# Patient Record
Sex: Male | Born: 2000 | Race: Black or African American | Hispanic: No | Marital: Single | Smoking: Never smoker
Health system: Southern US, Community
[De-identification: ages and names within clinical notes are randomized; demographics above are authoritative.]

---
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
## 2019-03-02 ENCOUNTER — Ambulatory Visit (HOSPITAL_COMMUNITY)
Admission: EM | Admit: 2019-03-02 | Discharge: 2019-03-02 | Disposition: A | Discharge status: Home / Self Care | Payer: Medicaid Other | Attending: Family Medicine | Admitting: Family Medicine

## 2019-03-02 ENCOUNTER — Other Ambulatory Visit: Payer: Self-pay

## 2019-03-02 ENCOUNTER — Encounter (HOSPITAL_COMMUNITY): Payer: Self-pay

## 2019-03-02 DIAGNOSIS — Z113 Encounter for screening for infections with a predominantly sexual mode of transmission: Secondary | ICD-10-CM | POA: Diagnosis present

## 2019-03-02 LAB — HIV ANTIBODY (ROUTINE TESTING W REFLEX): HIV Screen 4th Generation wRfx: NONREACTIVE

## 2019-03-02 NOTE — Discharge Instructions (Addendum)
We have sent testing for sexually transmitted infections. We will notify you of any positive results once they are received. If required, we will prescribe any medications you might need.  Please refrain from all sexual activity for at least the next seven days.  

## 2019-03-02 NOTE — ED Triage Notes (Signed)
Pt c/o "bumps" to penis, requests STI testing. Denies dysuria sx, abd pain, fever, chills.

## 2019-03-02 NOTE — ED Provider Notes (Signed)
[place]   [id number] 03/02/19 Arrival Time: 0626  ASSESSMENT & PLAN:  1. Screen for STD (sexually transmitted disease)       Discharge Instructions     We have sent testing for sexually transmitted infections. We will notify you of any positive results once they are received. If required, we will prescribe any medications you might need.  Please refrain from all sexual activity for at least the next seven days.     Pending: Labs Reviewed  HIV ANTIBODY (ROUTINE TESTING W REFLEX)  RPR  CYTOLOGY, (ORAL, ANAL, URETHRAL) ANCILLARY ONLY   Reassured that the few bumps on his penis do not look like HSV at this time. To watch through this week.  Will notify of any positive results. Instructed to refrain from sexual activity for at least seven days.  Reviewed expectations re: course of current medical issues. Questions answered. Outlined signs and symptoms indicating need for more acute intervention. Patient verbalized understanding. After Visit Summary given.   SUBJECTIVE:  Christian Gardner is a 19 y.o. male who requests STD testing. New male sexual partner. No penile discharge. Denies: urinary frequency, dysuria and gross hematuria. Afebrile. No abdominal or pelvic pain. No n/v. No rashes or lesions. Noticed "a few bumps on my penis this morning"; not painful; no drainage or bleeding. No h/o similar. OTC treatment: none. History of STI: none reported.   OBJECTIVE:  Vitals:   03/02/19 0928  BP: 121/78  Pulse: 75  Resp: 16  Temp: 99.3 F (37.4 C)  TempSrc: Oral  SpO2: 100%     General appearance: alert, cooperative, appears stated age and no distress Throat: lips, mucosa, and tongue normal; teeth and gums normal CV: RRR Lungs: CTAB Back: no CVA tenderness; FROM at waist Abdomen: soft, non-tender GU: normal appearing genitalia; 2-3 solitary 1-53mm skin-colored bumps on shaft of penis near hair follicles; no ulcerations or vesicles; non-tender to  palpation Skin: warm and dry Psychological: alert and cooperative; normal mood and affect.    Labs Reviewed  HIV ANTIBODY (ROUTINE TESTING W REFLEX)  RPR  CYTOLOGY, (ORAL, ANAL, URETHRAL) ANCILLARY ONLY    No Known Allergies  History reviewed. No pertinent past medical history. Family History  Family history unknown: Yes   Social History   Socioeconomic History  . Marital status: Single    Spouse name: Not on file  . Number of children: Not on file  . Years of education: Not on file  . Highest education level: Not on file  Occupational History  . Not on file  Tobacco Use  . Smoking status: Current Some Day Smoker  . Smokeless tobacco: Never Used  Substance and Sexual Activity  . Alcohol use: Yes  . Drug use: Yes    Types: Marijuana  . Sexual activity: Yes    Birth control/protection: None  Other Topics Concern  . Not on file  Social History Narrative  . Not on file   Social Determinants of Health   Financial Resource Strain:   . Difficulty of Paying Living Expenses: Not on file  Food Insecurity:   . Worried About Charity fundraiser in the Last Year: Not on file  . Ran Out of Food in the Last Year: Not on file  Transportation Needs:   . Lack of Transportation (Medical): Not on file  . Lack of Transportation (Non-Medical): Not on file  Physical Activity:   . Days of Exercise per Week: Not on file  . Minutes of Exercise per Session: Not  on file  Stress:   . Feeling of Stress : Not on file  Social Connections:   . Frequency of Communication with Friends and Family: Not on file  . Frequency of Social Gatherings with Friends and Family: Not on file  . Attends Religious Services: Not on file  . Active Member of Clubs or Organizations: Not on file  . Attends Banker Meetings: Not on file  . Marital Status: Not on file  Intimate Partner Violence:   . Fear of Current or Ex-Partner: Not on file  . Emotionally Abused: Not on file  . Physically  Abused: Not on file  . Sexually Abused: Not on file          Mardella Layman, MD 03/02/19 1014

## 2019-03-03 LAB — RPR: RPR Ser Ql: NONREACTIVE

## 2019-03-03 LAB — CYTOLOGY, (ORAL, ANAL, URETHRAL) ANCILLARY ONLY
Chlamydia: NEGATIVE
Neisseria Gonorrhea: NEGATIVE
Trichomonas: NEGATIVE

## 2019-05-29 ENCOUNTER — Encounter (HOSPITAL_COMMUNITY): Payer: Self-pay

## 2019-05-29 ENCOUNTER — Other Ambulatory Visit: Payer: Self-pay

## 2019-05-29 ENCOUNTER — Ambulatory Visit (HOSPITAL_COMMUNITY)
Admission: EM | Admit: 2019-05-29 | Discharge: 2019-05-29 | Disposition: A | Discharge status: Home / Self Care | Payer: Medicaid Other | Attending: Family Medicine | Admitting: Family Medicine

## 2019-05-29 DIAGNOSIS — Z20822 Contact with and (suspected) exposure to covid-19: Secondary | ICD-10-CM | POA: Insufficient documentation

## 2019-05-29 DIAGNOSIS — Z7689 Persons encountering health services in other specified circumstances: Secondary | ICD-10-CM | POA: Insufficient documentation

## 2019-05-29 LAB — HIV ANTIBODY (ROUTINE TESTING W REFLEX): HIV Screen 4th Generation wRfx: NONREACTIVE

## 2019-05-29 NOTE — Discharge Instructions (Addendum)
Safe sex recommended Check for your results on MyChart You will be called if any of your test results are positive

## 2019-05-29 NOTE — ED Triage Notes (Signed)
Pt presents to UC for COVID testing and STD's testing. Pt denies any signs and symptoms.

## 2019-05-29 NOTE — ED Provider Notes (Signed)
[facility]    CSN: [id number] Arrival date & time: 05/29/19  1119      History   Chief Complaint Chief Complaint  Patient presents with  . STD testing  . COVID testing    HPI Christian Gardner is a 19 y.o. male.   HPI  Patient desires STD testing.  He does have sexual relations with no protection.  He does not know of any specific exposure.  He states he wants STD testing "just to be safe". Patient desires Covid testing.  No symptoms.  No specific exposure.  History reviewed. No pertinent past medical history.  There are no problems to display for this patient.   History reviewed. No pertinent surgical history.     Home Medications    Prior to Admission medications   Not on File    Family History Family History  Family history unknown: Yes    Social History Social History   Tobacco Use  . Smoking status: Current Some Day Smoker  . Smokeless tobacco: Never Used  Substance Use Topics  . Alcohol use: Yes  . Drug use: Yes    Types: Marijuana     Allergies   Patient has no known allergies.   Review of Systems Review of Systems  Constitutional:       Patient denies any symptoms     Physical Exam Triage Vital Signs ED Triage Vitals  Enc Vitals Group     BP 05/29/19 1152 126/76     Pulse Rate 05/29/19 1152 (!) 55     Resp 05/29/19 1152 15     Temp 05/29/19 1152 98.7 F (37.1 C)     Temp Source 05/29/19 1152 Oral     SpO2 05/29/19 1152 99 %     Weight --      Height --      Head Circumference --      Peak Flow --      Pain Score 05/29/19 1151 0     Pain Loc --      Pain Edu? --      Excl. in GC? --    No data found.  Updated Vital Signs BP 126/76 (BP Location: Left Arm)   Pulse (!) 55   Temp 98.7 F (37.1 C) (Oral)   Resp 15   SpO2 99%     Physical Exam Constitutional:      General: He is not in acute distress.    Appearance: He is well-developed.  HENT:     Head: Normocephalic and atraumatic.     Mouth/Throat:       Comments: Mask is in place Eyes:     Conjunctiva/sclera: Conjunctivae normal.     Pupils: Pupils are equal, round, and reactive to light.  Neck:     Comments: Exam by observation Cardiovascular:     Rate and Rhythm: Normal rate.  Pulmonary:     Effort: Pulmonary effort is normal. No respiratory distress.  Musculoskeletal:        General: Normal range of motion.     Cervical back: Normal range of motion.  Skin:    General: Skin is warm and dry.  Neurological:     Mental Status: He is alert.  Psychiatric:        Behavior: Behavior normal.      UC Treatments / Results  Labs (all labs ordered are listed, but only abnormal results are displayed) Labs Reviewed  SARS CORONAVIRUS 2 (TAT 6-24 HRS)  HIV ANTIBODY (  ROUTINE TESTING W REFLEX)  RPR  CYTOLOGY, (ORAL, ANAL, URETHRAL) ANCILLARY ONLY    EKG   Radiology No results found.  Procedures Procedures (including critical care time)  Medications Ordered in UC Medications - No data to display  Initial Impression / Assessment and Plan / UC Course  I have reviewed the triage vital signs and the nursing notes.  Pertinent labs & imaging results that were available during my care of the patient were reviewed by me and considered in my medical decision making (see chart for details).      Final Clinical Impressions(s) / UC Diagnoses   Final diagnoses:  Encounter for screening laboratory testing for COVID-19 virus  Encounter for assessment of STD exposure     Discharge Instructions     Safe sex recommended Check for your results on MyChart You will be called if any of your test results are positive    ED Prescriptions    None     PDMP not reviewed this encounter.   Raylene Everts, MD 05/29/19 [phone]

## 2019-05-30 LAB — RPR: RPR Ser Ql: NONREACTIVE

## 2019-05-30 LAB — SARS CORONAVIRUS 2 (TAT 6-24 HRS): SARS Coronavirus 2: NEGATIVE

## 2019-06-02 LAB — CYTOLOGY, (ORAL, ANAL, URETHRAL) ANCILLARY ONLY
Chlamydia: NEGATIVE
Comment: NEGATIVE
Comment: NEGATIVE
Comment: NORMAL
Neisseria Gonorrhea: NEGATIVE
Trichomonas: NEGATIVE

## 2019-07-30 DIAGNOSIS — Z419 Encounter for procedure for purposes other than remedying health state, unspecified: Secondary | ICD-10-CM | POA: Diagnosis not present

## 2019-08-04 ENCOUNTER — Encounter (HOSPITAL_COMMUNITY): Payer: Self-pay

## 2019-08-04 ENCOUNTER — Other Ambulatory Visit: Payer: Self-pay

## 2019-08-04 ENCOUNTER — Ambulatory Visit (HOSPITAL_COMMUNITY)
Admission: EM | Admit: 2019-08-04 | Discharge: 2019-08-04 | Disposition: A | Discharge status: Home / Self Care | Payer: Medicaid Other | Attending: Family Medicine | Admitting: Family Medicine

## 2019-08-04 DIAGNOSIS — Z20822 Contact with and (suspected) exposure to covid-19: Secondary | ICD-10-CM

## 2019-08-04 DIAGNOSIS — Z7689 Persons encountering health services in other specified circumstances: Secondary | ICD-10-CM

## 2019-08-04 LAB — SARS CORONAVIRUS 2 (TAT 6-24 HRS): SARS Coronavirus 2: NEGATIVE

## 2019-08-04 NOTE — ED Triage Notes (Signed)
Pt wants COVID test just to be checked, pt denies symptoms. PT wants STI testing, denies symptoms.

## 2019-08-04 NOTE — Discharge Instructions (Addendum)
Check for your results on My Chart.

## 2019-08-05 ENCOUNTER — Telehealth (HOSPITAL_COMMUNITY): Payer: Self-pay | Admitting: Emergency Medicine

## 2019-08-05 LAB — CYTOLOGY, (ORAL, ANAL, URETHRAL) ANCILLARY ONLY
Chlamydia: POSITIVE — AB
Comment: NEGATIVE
Comment: NEGATIVE
Comment: NORMAL
Neisseria Gonorrhea: NEGATIVE
Trichomonas: POSITIVE — AB

## 2019-08-05 MED ORDER — METRONIDAZOLE 500 MG PO TABS: 500.0000 mg | ORAL_TABLET | Freq: Two times a day (BID) | ORAL | 0 refills | Status: DC

## 2019-08-05 MED ORDER — AZITHROMYCIN 250 MG PO TABS: 1000.0000 mg | ORAL_TABLET | Freq: Once | ORAL | 0 refills | Status: AC

## 2019-08-05 NOTE — Telephone Encounter (Signed)
Attempted to call patient, LVM for return call.  Sent in Azithromycin 1g x 1, and Metronidazole 500mg  BID x 7 days to pharmacy on file.

## 2019-08-05 NOTE — ED Provider Notes (Signed)
I have seen patient recently for the same complaint He has no symptoms whatsoever  he has no known exposure to illness that he will admit to He wants STD testing, swab only no blood He wants Covid testing He states that he wants them "to be safe" He does admit to unprotected sexual relations  BP 132/71   Pulse 69   Temp 98.2 F (36.8 C) (Oral)   Resp 16   Ht 6\' 1"  (1.854 m)   Wt 72.6 kg   SpO2 100%   BMI 21.11 kg/m   Patient appears lean athletic.  In no distress  Test testing is performed  Patient is notified to get results on MyChart  Safe sex is recommended  Free-Covid testing is available    , MD 08/05/19 2113

## 2019-08-17 ENCOUNTER — Telehealth: Payer: Self-pay | Admitting: *Deleted

## 2019-08-17 NOTE — Telephone Encounter (Signed)
Attempted to call patient in response to red Emmi, left message for return number

## 2019-08-30 DIAGNOSIS — Z419 Encounter for procedure for purposes other than remedying health state, unspecified: Secondary | ICD-10-CM | POA: Diagnosis not present

## 2019-09-15 ENCOUNTER — Ambulatory Visit
Admission: EM | Admit: 2019-09-15 | Discharge: 2019-09-15 | Disposition: A | Discharge status: Home / Self Care | Payer: Medicaid Other | Attending: Physician Assistant | Admitting: Physician Assistant

## 2019-09-15 DIAGNOSIS — Z113 Encounter for screening for infections with a predominantly sexual mode of transmission: Secondary | ICD-10-CM | POA: Insufficient documentation

## 2019-09-15 DIAGNOSIS — Z1152 Encounter for screening for COVID-19: Secondary | ICD-10-CM | POA: Insufficient documentation

## 2019-09-15 NOTE — ED Provider Notes (Signed)
[facility]    CSN: [id number] Arrival date & time: 09/15/19  1026      History   Chief Complaint Chief Complaint  Patient presents with  . SEXUALLY TRANSMITTED DISEASE    HPI Christian Gardner is a 19 y.o. male.   19 year old male comes in for STD testing. He is asymptomatic. Was treated for chlamydia and trich 1 month ago and would like recheck.     History reviewed. No pertinent past medical history.  There are no problems to display for this patient.   History reviewed. No pertinent surgical history.     Home Medications    Prior to Admission medications   Not on File    Family History Family History  Family history unknown: Yes    Social History Social History   Tobacco Use  . Smoking status: Never Smoker  . Smokeless tobacco: Never Used  Vaping Use  . Vaping Use: Never used  Substance Use Topics  . Alcohol use: Yes    Comment: occ  . Drug use: Yes    Types: Marijuana     Allergies   Patient has no known allergies.   Review of Systems Review of Systems  Reason unable to perform ROS: See HPI as above.     Physical Exam Triage Vital Signs ED Triage Vitals  Enc Vitals Group     BP 09/15/19 1049 126/79     Pulse Rate 09/15/19 1049 73     Resp 09/15/19 1049 18     Temp 09/15/19 1049 99 F (37.2 C)     Temp Source 09/15/19 1049 Oral     SpO2 09/15/19 1049 99 %     Weight --      Height --      Head Circumference --      Peak Flow --      Pain Score 09/15/19 1050 0     Pain Loc --      Pain Edu? --      Excl. in GC? --    No data found.  Updated Vital Signs BP 126/79 (BP Location: Left Arm)   Pulse 73   Temp 99 F (37.2 C) (Oral)   Resp 18   SpO2 99%   Visual Acuity Right Eye Distance:   Left Eye Distance:   Bilateral Distance:    Right Eye Near:   Left Eye Near:    Bilateral Near:     Physical Exam Constitutional:      General: He is not in acute distress.    Appearance: Normal appearance. He is  well-developed. He is not toxic-appearing or diaphoretic.  HENT:     Head: Normocephalic and atraumatic.  Eyes:     Conjunctiva/sclera: Conjunctivae normal.     Pupils: Pupils are equal, round, and reactive to light.  Pulmonary:     Effort: Pulmonary effort is normal. No respiratory distress.  Musculoskeletal:     Cervical back: Normal range of motion and neck supple.  Skin:    General: Skin is warm and dry.  Neurological:     Mental Status: He is alert and oriented to person, place, and time.      UC Treatments / Results  Labs (all labs ordered are listed, but only abnormal results are displayed) Labs Reviewed  NOVEL CORONAVIRUS, NAA  CYTOLOGY, (ORAL, ANAL, URETHRAL) ANCILLARY ONLY    EKG   Radiology No results found.  Procedures Procedures (including critical care time)  Medications Ordered  in UC Medications - No data to display  Initial Impression / Assessment and Plan / UC Course  I have reviewed the triage vital signs and the nursing notes.  Pertinent labs & imaging results that were available during my care of the patient were reviewed by me and considered in my medical decision making (see chart for details).     Cytology sent, patient will be contacted with any positive results that require additional treatment. Return precautions given.   Final Clinical Impressions(s) / UC Diagnoses   Final diagnoses:  Encounter for screening for COVID-19  Screening for STD (sexually transmitted disease)    ED Prescriptions    None     PDMP not reviewed this encounter.   Belinda Fisher, PA-C 09/15/19 1134

## 2019-09-15 NOTE — Discharge Instructions (Signed)
Cytology sent, you will be contacted with any positive results that requires further treatment. Refrain from sexual activity for the next 7 days. If developing testicular swelling/pain, penile lesion/sore, follow up for reevaluation.   

## 2019-09-15 NOTE — ED Triage Notes (Signed)
Pt requesting STD and COVID testing. Denies any s/sx's. States he had a STD and tx'd for it a few weeks ago.

## 2019-09-16 LAB — NOVEL CORONAVIRUS, NAA: SARS-CoV-2, NAA: NOT DETECTED

## 2019-09-16 LAB — CYTOLOGY, (ORAL, ANAL, URETHRAL) ANCILLARY ONLY
Chlamydia: NEGATIVE
Comment: NEGATIVE
Comment: NEGATIVE
Comment: NORMAL
Neisseria Gonorrhea: NEGATIVE
Trichomonas: NEGATIVE

## 2019-09-16 LAB — SARS-COV-2, NAA 2 DAY TAT

## 2019-09-30 DIAGNOSIS — Z419 Encounter for procedure for purposes other than remedying health state, unspecified: Secondary | ICD-10-CM | POA: Diagnosis not present

## 2019-10-16 ENCOUNTER — Ambulatory Visit
Admission: EM | Admit: 2019-10-16 | Discharge: 2019-10-16 | Disposition: A | Discharge status: Home / Self Care | Payer: Medicaid Other | Attending: Physician Assistant | Admitting: Physician Assistant

## 2019-10-16 DIAGNOSIS — Z1152 Encounter for screening for COVID-19: Secondary | ICD-10-CM | POA: Insufficient documentation

## 2019-10-16 DIAGNOSIS — Z113 Encounter for screening for infections with a predominantly sexual mode of transmission: Secondary | ICD-10-CM | POA: Diagnosis not present

## 2019-10-16 NOTE — ED Provider Notes (Signed)
[facility]    CSN: [id number] Arrival date & time: 10/16/19  1843      History   Chief Complaint Chief Complaint  Patient presents with  . covid testing - no sx  . SEXUALLY TRANSMITTED DISEASE    HPI Nysir Fergusson is a 19 y.o. male.   19 year old male comes in for STD testing. Denies symptoms such as testicular pain, penile discharge, dysuria. Denies known exposure. He had cytology done 1 month ago, at the time, had wanted to make sure chlamydia cleared. Testing came back negative. States "just wanted another test"     History reviewed. No pertinent past medical history.  There are no problems to display for this patient.   History reviewed. No pertinent surgical history.     Home Medications    Prior to Admission medications   Not on File    Family History Family History  Family history unknown: Yes    Social History Social History   Tobacco Use  . Smoking status: Never Smoker  . Smokeless tobacco: Never Used  Vaping Use  . Vaping Use: Never used  Substance Use Topics  . Alcohol use: Yes    Comment: occ  . Drug use: Yes    Types: Marijuana     Allergies   Patient has no known allergies.   Review of Systems Review of Systems  Reason unable to perform ROS: See HPI as above.     Physical Exam Triage Vital Signs ED Triage Vitals [10/16/19 1857]  Enc Vitals Group     BP (!) 163/72     Pulse Rate (!) 59     Resp 18     Temp 98.8 F (37.1 C)     Temp Source Oral     SpO2 98 %     Weight      Height      Head Circumference      Peak Flow      Pain Score 0     Pain Loc      Pain Edu?      Excl. in GC?    No data found.  Updated Vital Signs BP (!) 163/72 (BP Location: Left Arm)   Pulse (!) 59   Temp 98.8 F (37.1 C) (Oral)   Resp 18   SpO2 98%   Physical Exam Constitutional:      General: He is not in acute distress.    Appearance: Normal appearance. He is well-developed. He is not toxic-appearing or  diaphoretic.  HENT:     Head: Normocephalic and atraumatic.  Eyes:     Conjunctiva/sclera: Conjunctivae normal.     Pupils: Pupils are equal, round, and reactive to light.  Pulmonary:     Effort: Pulmonary effort is normal. No respiratory distress.  Musculoskeletal:     Cervical back: Normal range of motion and neck supple.  Skin:    General: Skin is warm and dry.  Neurological:     Mental Status: He is alert and oriented to person, place, and time.      UC Treatments / Results  Labs (all labs ordered are listed, but only abnormal results are displayed) Labs Reviewed  NOVEL CORONAVIRUS, NAA  CYTOLOGY, (ORAL, ANAL, URETHRAL) ANCILLARY ONLY    EKG   Radiology No results found.  Procedures Procedures (including critical care time)  Medications Ordered in UC Medications - No data to display  Initial Impression / Assessment and Plan / UC Course  I  have reviewed the triage vital signs and the nursing notes.  Pertinent labs & imaging results that were available during my care of the patient were reviewed by me and considered in my medical decision making (see chart for details).    Cytology sent, patient will be contacted with any positive results that require additional treatment. Return precautions given.   Final Clinical Impressions(s) / UC Diagnoses   Final diagnoses:  Encounter for screening for COVID-19  Screening for STD (sexually transmitted disease)    ED Prescriptions    None     PDMP not reviewed this encounter.   Belinda Fisher, PA-C 10/16/19 1921

## 2019-10-16 NOTE — ED Triage Notes (Signed)
Pt requesting COVID testing and routine STD testing. Denies any sx's or exposures.

## 2019-10-16 NOTE — Discharge Instructions (Signed)
Cytology sent, you will be contacted with any positive results that requires further treatment.  If developing testicular swelling/pain, penile lesion/sore, follow up for reevaluation.

## 2019-10-19 LAB — NOVEL CORONAVIRUS, NAA: SARS-CoV-2, NAA: NOT DETECTED

## 2019-10-20 LAB — CYTOLOGY, (ORAL, ANAL, URETHRAL) ANCILLARY ONLY
Chlamydia: POSITIVE — AB
Comment: NEGATIVE
Comment: NEGATIVE
Comment: NORMAL
Neisseria Gonorrhea: NEGATIVE
Trichomonas: NEGATIVE

## 2019-10-21 ENCOUNTER — Telehealth (HOSPITAL_COMMUNITY): Payer: Self-pay | Admitting: Emergency Medicine

## 2019-10-21 MED ORDER — AZITHROMYCIN 250 MG PO TABS: 1000.0000 mg | ORAL_TABLET | Freq: Once | ORAL | 0 refills | Status: AC

## 2019-10-30 DIAGNOSIS — Z419 Encounter for procedure for purposes other than remedying health state, unspecified: Secondary | ICD-10-CM | POA: Diagnosis not present

## 2019-11-19 ENCOUNTER — Other Ambulatory Visit: Payer: Self-pay

## 2019-11-19 ENCOUNTER — Ambulatory Visit
Admission: EM | Admit: 2019-11-19 | Discharge: 2019-11-19 | Disposition: A | Discharge status: Home / Self Care | Payer: Medicaid Other | Attending: Emergency Medicine | Admitting: Emergency Medicine

## 2019-11-19 ENCOUNTER — Encounter: Payer: Self-pay | Admitting: *Deleted

## 2019-11-19 DIAGNOSIS — Z20822 Contact with and (suspected) exposure to covid-19: Secondary | ICD-10-CM | POA: Insufficient documentation

## 2019-11-19 DIAGNOSIS — Z113 Encounter for screening for infections with a predominantly sexual mode of transmission: Secondary | ICD-10-CM | POA: Diagnosis not present

## 2019-11-19 LAB — POCT URINALYSIS DIP (MANUAL ENTRY)
Bilirubin, UA: NEGATIVE
Blood, UA: NEGATIVE
Glucose, UA: NEGATIVE mg/dL
Ketones, POC UA: NEGATIVE mg/dL
Leukocytes, UA: NEGATIVE
Nitrite, UA: NEGATIVE
Protein Ur, POC: NEGATIVE mg/dL
Spec Grav, UA: >=1.03 — AB (ref 1.010–1.025)
Urobilinogen, UA: 0.2 E.U./dL
pH, UA: 6.5 (ref 5.0–8.0)

## 2019-11-19 NOTE — ED Provider Notes (Signed)
Emergency Department Provider Note  ____________________________________________  Time seen: Approximately 1:11 PM  I have reviewed the triage vital signs and the nursing notes.   HISTORY  Chief Complaint STD Check and Covid Test - no sxs   Historian Patient     HPI Christian Gardner is a 19 y.o. male presents to the urgent care requesting STD testing and COVID-19 testing.  Patient is well-known to this urgent care and is currently asymptomatic.   History reviewed. No pertinent past medical history.   Immunizations up to date:  Yes.     History reviewed. No pertinent past medical history.  There are no problems to display for this patient.   History reviewed. No pertinent surgical history.  Prior to Admission medications   Not on File    Allergies Patient has no known allergies.  Family History  Problem Relation Age of Onset  . Healthy Mother   . Healthy Father     Social History Social History   Tobacco Use  . Smoking status: Never Smoker  . Smokeless tobacco: Never Used  Vaping Use  . Vaping Use: Never used  Substance Use Topics  . Alcohol use: Yes    Comment: occasionally  . Drug use: Yes    Types: Marijuana     Review of Systems  Constitutional: No fever/chills Eyes:  No discharge ENT: No upper respiratory complaints. Respiratory: no cough. No SOB/ use of accessory muscles to breath Gastrointestinal:   No nausea, no vomiting.  No diarrhea.  No constipation. Musculoskeletal: Negative for musculoskeletal pain. Skin: Negative for rash, abrasions, lacerations, ecchymosis.    ____________________________________________   PHYSICAL EXAM:  VITAL SIGNS: ED Triage Vitals  Enc Vitals Group     BP 11/19/19 1227 117/74     Pulse Rate 11/19/19 1227 62     Resp 11/19/19 1227 20     Temp 11/19/19 1227 98.1 F (36.7 C)     Temp Source 11/19/19 1227 Oral     SpO2 11/19/19 1227 99 %     Weight --      Height --      Head Circumference --       Peak Flow --      Pain Score 11/19/19 1229 0     Pain Loc --      Pain Edu? --      Excl. in GC? --      Constitutional: Alert and oriented. Well appearing and in no acute distress. Eyes: Conjunctivae are normal. PERRL. EOMI. Head: Atraumatic. ENT:      Nose: No congestion/rhinnorhea.      Mouth/Throat: Mucous membranes are moist.  Neck: No stridor.  No cervical spine tenderness to palpation. Cardiovascular: Normal rate, regular rhythm. Normal S1 and S2.  Good peripheral circulation. Respiratory: Normal respiratory effort without tachypnea or retractions. Lungs CTAB. Good air entry to the bases with no decreased or absent breath sounds Gastrointestinal: Bowel sounds x 4 quadrants. Soft and nontender to palpation. No guarding or rigidity. No distention. Musculoskeletal: Full range of motion to all extremities. No obvious deformities noted Neurologic:  Normal for age. No gross focal neurologic deficits are appreciated.  Skin:  Skin is warm, dry and intact. No rash noted. Psychiatric: Mood and affect are normal for age. Speech and behavior are normal.   ____________________________________________   LABS (all labs ordered are listed, but only abnormal results are displayed)  Labs Reviewed  POCT URINALYSIS DIP (MANUAL ENTRY) - Abnormal; Notable for the following components:  Result Value   Spec Grav, UA >=1.030 (*)    All other components within normal limits  NOVEL CORONAVIRUS, NAA  CYTOLOGY, (ORAL, ANAL, URETHRAL) ANCILLARY ONLY   ____________________________________________  EKG   ____________________________________________  RADIOLOGY   No results found.  ____________________________________________    PROCEDURES  Procedure(s) performed:     Procedures     Medications - No data to display   ____________________________________________   INITIAL IMPRESSION / ASSESSMENT AND PLAN / ED COURSE  Pertinent labs & imaging results that were  available during my care of the patient were reviewed by me and considered in my medical decision making (see chart for details).      Assessment and Plan: Routine testing for COVID-19 and STDs. 19 year old male presents to the urgent care for routine testing for gonorrhea and chlamydia and COVID-19.  He is currently completely asymptomatic and has not had any recent exposures.  COVID-19 plan STD testing are in process at this time.  Urinalysis did not suggest UTI.  Return precautions were given to return with new or worsening symptoms.   ____________________________________________  FINAL CLINICAL IMPRESSION(S) / ED DIAGNOSES  Final diagnoses:  Encounter for laboratory testing for COVID-19 virus  Screen for STD (sexually transmitted disease)      NEW MEDICATIONS STARTED DURING THIS VISIT:  ED Discharge Orders    None          This chart was dictated using voice recognition software/Dragon. Despite best efforts to proofread, errors can occur which can change the meaning. Any change was purely unintentional.     Orvil Feil, PA-C 11/19/19 1314

## 2019-11-19 NOTE — ED Triage Notes (Signed)
Pt denies any penile discharge or any known exposures to STDs; wishes to be checked for STDs. Denies any Covid sxs or exposures, but wishes to be tested.

## 2019-11-20 LAB — NOVEL CORONAVIRUS, NAA: SARS-CoV-2, NAA: NOT DETECTED

## 2019-11-20 LAB — SARS-COV-2, NAA 2 DAY TAT

## 2019-11-24 LAB — CYTOLOGY, (ORAL, ANAL, URETHRAL) ANCILLARY ONLY
Chlamydia: NEGATIVE
Comment: NEGATIVE
Comment: NORMAL
Neisseria Gonorrhea: NEGATIVE

## 2019-11-30 DIAGNOSIS — Z419 Encounter for procedure for purposes other than remedying health state, unspecified: Secondary | ICD-10-CM | POA: Diagnosis not present

## 2019-12-04 DIAGNOSIS — Z202 Contact with and (suspected) exposure to infections with a predominantly sexual mode of transmission: Secondary | ICD-10-CM | POA: Diagnosis not present

## 2019-12-04 DIAGNOSIS — Z113 Encounter for screening for infections with a predominantly sexual mode of transmission: Secondary | ICD-10-CM | POA: Diagnosis not present

## 2019-12-30 DIAGNOSIS — Z419 Encounter for procedure for purposes other than remedying health state, unspecified: Secondary | ICD-10-CM | POA: Diagnosis not present

## 2020-01-14 ENCOUNTER — Ambulatory Visit
Admission: RE | Admit: 2020-01-14 | Discharge: 2020-01-14 | Disposition: A | Discharge status: Home / Self Care | Payer: Medicaid Other | Source: Ambulatory Visit | Attending: Emergency Medicine | Admitting: Emergency Medicine

## 2020-01-14 ENCOUNTER — Other Ambulatory Visit: Payer: Self-pay

## 2020-01-14 VITALS — BP 123/80 | HR 72 | Temp 98.3°F | Resp 17

## 2020-01-14 DIAGNOSIS — Z113 Encounter for screening for infections with a predominantly sexual mode of transmission: Secondary | ICD-10-CM | POA: Insufficient documentation

## 2020-01-14 NOTE — Discharge Instructions (Signed)
We are testing you for HIV, syphillis, Gonorrhea, Chlamydia and Trichomonas. We will call you if anything is positive and let you know if you require any further treatment. Please inform partner of any positive results.  Please return if symptoms not improving with treatment, development of fever, nausea, vomiting, abdominal pain, scrotal pain.

## 2020-01-14 NOTE — ED Provider Notes (Signed)
[facility]    CSN: [id number] Arrival date & time: 01/14/20  1247      History   Chief Complaint Chief Complaint  Patient presents with  . SEXUALLY TRANSMITTED DISEASE    Asymptomatic no exposure    HPI Christian Gardner is a 19 y.o. male presenting today for STD screening.  Denies any specific symptoms or exposures.  Has regular screenings.  Would like to include HIV and syphilis today.   HPI  History reviewed. No pertinent past medical history.  There are no problems to display for this patient.   History reviewed. No pertinent surgical history.     Home Medications    Prior to Admission medications   Not on File    Family History Family History  Problem Relation Age of Onset  . Healthy Mother   . Healthy Father     Social History Social History   Tobacco Use  . Smoking status: Never Smoker  . Smokeless tobacco: Never Used  Vaping Use  . Vaping Use: Never used  Substance Use Topics  . Alcohol use: Yes    Comment: occasionally  . Drug use: Yes    Types: Marijuana     Allergies   Patient has no known allergies.   Review of Systems Review of Systems  Constitutional: Negative for fever.  HENT: Negative for sore throat.   Respiratory: Negative for shortness of breath.   Cardiovascular: Negative for chest pain.  Gastrointestinal: Negative for abdominal pain, nausea and vomiting.  Genitourinary: Negative for difficulty urinating, dysuria, frequency, penile discharge, penile pain, penile swelling, scrotal swelling and testicular pain.  Skin: Negative for rash.  Neurological: Negative for dizziness, light-headedness and headaches.     Physical Exam Triage Vital Signs ED Triage Vitals [01/14/20 1321]  Enc Vitals Group     BP 123/80     Pulse Rate 72     Resp 17     Temp 98.3 F (36.8 C)     Temp Source Oral     SpO2 99 %     Weight      Height      Head Circumference      Peak Flow      Pain Score 0     Pain Loc       Pain Edu?      Excl. in GC?    No data found.  Updated Vital Signs BP 123/80 (BP Location: Left Arm)   Pulse 72   Temp 98.3 F (36.8 C) (Oral)   Resp 17   SpO2 99%   Visual Acuity Right Eye Distance:   Left Eye Distance:   Bilateral Distance:    Right Eye Near:   Left Eye Near:    Bilateral Near:     Physical Exam Vitals and nursing note reviewed.  Constitutional:      Appearance: He is well-developed and well-nourished.     Comments: No acute distress  HENT:     Head: Normocephalic and atraumatic.     Nose: Nose normal.  Eyes:     Conjunctiva/sclera: Conjunctivae normal.  Cardiovascular:     Rate and Rhythm: Normal rate.  Pulmonary:     Effort: Pulmonary effort is normal. No respiratory distress.  Abdominal:     General: There is no distension.  Musculoskeletal:        General: Normal range of motion.     Cervical back: Neck supple.  Skin:    General: Skin is warm and  dry.  Neurological:     Mental Status: He is alert and oriented to person, place, and time.  Psychiatric:        Mood and Affect: Mood and affect normal.      UC Treatments / Results  Labs (all labs ordered are listed, but only abnormal results are displayed) Labs Reviewed  HIV ANTIBODY (ROUTINE TESTING W REFLEX)  RPR  CYTOLOGY, (ORAL, ANAL, URETHRAL) ANCILLARY ONLY    EKG   Radiology No results found.  Procedures Procedures (including critical care time)  Medications Ordered in UC Medications - No data to display  Initial Impression / Assessment and Plan / UC Course  I have reviewed the triage vital signs and the nursing notes.  Pertinent labs & imaging results that were available during my care of the patient were reviewed by me and considered in my medical decision making (see chart for details).     STD screening with urethral swab and blood work for HIV and syphilis.  Currently asymptomatic and without exposures.  Will call with results and provide treatment as  needed.  Discussed strict return precautions. Patient verbalized understanding and is agreeable with plan.  Final Clinical Impressions(s) / UC Diagnoses   Final diagnoses:  Screen for STD (sexually transmitted disease)     Discharge Instructions     We are testing you for HIV, syphillis, Gonorrhea, Chlamydia and Trichomonas. We will call you if anything is positive and let you know if you require any further treatment. Please inform partner of any positive results.  Please return if symptoms not improving with treatment, development of fever, nausea, vomiting, abdominal pain, scrotal pain.    ED Prescriptions    None     PDMP not reviewed this encounter.   Lew Dawes, [place] 01/14/20 1419

## 2020-01-14 NOTE — ED Triage Notes (Signed)
Pt denies any sti symptoms but would like to be checked. Pt is aox4 and ambulatory.

## 2020-01-15 ENCOUNTER — Telehealth (HOSPITAL_COMMUNITY): Payer: Self-pay | Admitting: Emergency Medicine

## 2020-01-15 LAB — CYTOLOGY, (ORAL, ANAL, URETHRAL) ANCILLARY ONLY
Chlamydia: NEGATIVE
Comment: NEGATIVE
Comment: NEGATIVE
Comment: NORMAL
Neisseria Gonorrhea: NEGATIVE
Trichomonas: POSITIVE — AB

## 2020-01-15 LAB — RPR: RPR Ser Ql: NONREACTIVE

## 2020-01-15 LAB — HIV ANTIBODY (ROUTINE TESTING W REFLEX): HIV Screen 4th Generation wRfx: NONREACTIVE

## 2020-01-15 MED ORDER — METRONIDAZOLE 500 MG PO TABS: 500.0000 mg | ORAL_TABLET | Freq: Two times a day (BID) | ORAL | 0 refills | Status: DC

## 2020-01-30 DIAGNOSIS — Z419 Encounter for procedure for purposes other than remedying health state, unspecified: Secondary | ICD-10-CM | POA: Diagnosis not present

## 2020-02-08 DIAGNOSIS — U071 COVID-19: Secondary | ICD-10-CM | POA: Diagnosis not present

## 2020-02-18 DIAGNOSIS — Z113 Encounter for screening for infections with a predominantly sexual mode of transmission: Secondary | ICD-10-CM | POA: Diagnosis not present

## 2020-02-18 DIAGNOSIS — Z7189 Other specified counseling: Secondary | ICD-10-CM | POA: Diagnosis not present

## 2020-02-18 DIAGNOSIS — Z713 Dietary counseling and surveillance: Secondary | ICD-10-CM | POA: Diagnosis not present

## 2020-02-18 DIAGNOSIS — Z1152 Encounter for screening for COVID-19: Secondary | ICD-10-CM | POA: Diagnosis not present

## 2020-02-18 DIAGNOSIS — Z0000 Encounter for general adult medical examination without abnormal findings: Secondary | ICD-10-CM | POA: Diagnosis not present

## 2020-02-25 DIAGNOSIS — R748 Abnormal levels of other serum enzymes: Secondary | ICD-10-CM | POA: Diagnosis not present

## 2020-03-01 DIAGNOSIS — R519 Headache, unspecified: Secondary | ICD-10-CM | POA: Diagnosis not present

## 2020-03-01 DIAGNOSIS — Z419 Encounter for procedure for purposes other than remedying health state, unspecified: Secondary | ICD-10-CM | POA: Diagnosis not present

## 2020-03-01 DIAGNOSIS — Z20822 Contact with and (suspected) exposure to covid-19: Secondary | ICD-10-CM | POA: Diagnosis not present

## 2020-03-01 DIAGNOSIS — R5383 Other fatigue: Secondary | ICD-10-CM | POA: Diagnosis not present

## 2020-03-01 DIAGNOSIS — J029 Acute pharyngitis, unspecified: Secondary | ICD-10-CM | POA: Diagnosis not present

## 2020-03-03 DIAGNOSIS — J029 Acute pharyngitis, unspecified: Secondary | ICD-10-CM | POA: Diagnosis not present

## 2020-03-03 DIAGNOSIS — R0981 Nasal congestion: Secondary | ICD-10-CM | POA: Diagnosis not present

## 2020-03-07 ENCOUNTER — Telehealth: Payer: Medicaid Other | Admitting: Physician Assistant

## 2020-03-07 DIAGNOSIS — Z202 Contact with and (suspected) exposure to infections with a predominantly sexual mode of transmission: Secondary | ICD-10-CM | POA: Diagnosis not present

## 2020-03-07 MED ORDER — TINIDAZOLE 500 MG PO TABS: 2.0000 g | ORAL_TABLET | Freq: Once | ORAL | 0 refills | Status: AC

## 2020-03-07 NOTE — Progress Notes (Signed)
Christian Gardner, sones are scheduled for a virtual visit with your provider today.    Just as we do with appointments in the office, we must obtain your consent to participate.  Your consent will be active for this visit and any virtual visit you may have with one of our providers in the next 365 days.    If you have a MyChart account, I can also send a copy of this consent to you electronically.  All virtual visits are billed to your insurance company just like a traditional visit in the office.  As this is a virtual visit, video technology does not allow for your provider to perform a traditional examination.  This may limit your provider's ability to fully assess your condition.  If your provider identifies any concerns that need to be evaluated in person or the need to arrange testing such as labs, EKG, etc, we will make arrangements to do so.    Although advances in technology are sophisticated, we cannot ensure that it will always work on either your end or our end.  If the connection with a video visit is poor, we may have to switch to a telephone visit.  With either a video or telephone visit, we are not always able to ensure that we have a secure connection.   I need to obtain your verbal consent now.   Are you willing to proceed with your visit today?   Christian Gardner has provided verbal consent on 03/07/2020 for a virtual visit (video or telephone).   Piedad Climes, PA-C 03/07/2020  6:13 PM      Virtual Visit via Video   I connected with patient on 03/07/20 at  6:30 PM EST by a video enabled telemedicine application and verified that I am speaking with the correct person using two identifiers.  Location patient: Home Location provider: [facility] Persons participating in the virtual visit: Patient, Provider  I discussed the limitations of evaluation and management by telemedicine and the availability of in person appointments. The patient expressed understanding and  agreed to proceed.  Subjective:   HPI:   Patient presents via [facility] today to discuss potential recent exposure to trichomoniasis from sexual partner.  Patient notes he was just notified by recent partner that she tested positive for trichomoniasis.  Other testing was negative.  Patient states he has a history of trichomoniasis, treated in December of last year.  Notes he is not having any GU symptoms.  Is wanting to know if the medicine can be sent in for him.  ROS:   See pertinent positives and negatives per HPI.  There are no problems to display for this patient.   Social History   Tobacco Use  . Smoking status: Never Smoker  . Smokeless tobacco: Never Used  Substance Use Topics  . Alcohol use: Yes    Comment: occasionally    Current Outpatient Medications:  .  metroNIDAZOLE (FLAGYL) 500 MG tablet, Take 1 tablet (500 mg total) by mouth 2 (two) times daily., Disp: 14 tablet, Rfl: 0  No Known Allergies  Objective:   There were no vitals taken for this visit.  Patient is well-developed, well-nourished in no acute distress.  Resting comfortably at home.  Head is normocephalic, atraumatic.  No labored breathing.  Speech is clear and coherent with logical content.  Patient is alert and oriented at baseline.   Assessment and Plan:   1. Exposure to trichomonas Known exposure.  Asymptomatic.  Feel it  would be best to empirically treat for trichomoniasis to prevent passing this along.  Will Rx atenolol to take as directed.  Discussed with patient that even though his partner said other tests were clear, it is in his best interest to get a full STI test.  He does have a primary care provider endorses he will call them first thing in the morning to get an appointment for further testing and treatment if indicated.  Safe sex practices reviewed with patient. - tinidazole (TINDAMAX) 500 MG tablet; Take 4 tablets (2,000 mg total) by mouth once for 1 dose.  Dispense: 4 tablet;  Refill: 0    Piedad Climes, [place] 03/07/2020

## 2020-03-11 DIAGNOSIS — Z113 Encounter for screening for infections with a predominantly sexual mode of transmission: Secondary | ICD-10-CM | POA: Diagnosis not present

## 2020-03-11 DIAGNOSIS — Z7253 High risk bisexual behavior: Secondary | ICD-10-CM | POA: Diagnosis not present

## 2020-03-11 DIAGNOSIS — R748 Abnormal levels of other serum enzymes: Secondary | ICD-10-CM | POA: Diagnosis not present

## 2020-03-29 DIAGNOSIS — Z419 Encounter for procedure for purposes other than remedying health state, unspecified: Secondary | ICD-10-CM | POA: Diagnosis not present

## 2020-04-29 DIAGNOSIS — Z419 Encounter for procedure for purposes other than remedying health state, unspecified: Secondary | ICD-10-CM | POA: Diagnosis not present

## 2020-05-29 DIAGNOSIS — Z419 Encounter for procedure for purposes other than remedying health state, unspecified: Secondary | ICD-10-CM | POA: Diagnosis not present

## 2020-06-29 DIAGNOSIS — Z419 Encounter for procedure for purposes other than remedying health state, unspecified: Secondary | ICD-10-CM | POA: Diagnosis not present

## 2020-07-14 ENCOUNTER — Encounter (HOSPITAL_COMMUNITY): Payer: Self-pay | Admitting: Emergency Medicine

## 2020-07-14 ENCOUNTER — Emergency Department (HOSPITAL_COMMUNITY): Payer: Medicaid Other

## 2020-07-14 ENCOUNTER — Emergency Department (HOSPITAL_COMMUNITY)
Admission: EM | Admit: 2020-07-14 | Discharge: 2020-07-14 | Disposition: A | Discharge status: Home / Self Care | Payer: Medicaid Other | Attending: Emergency Medicine | Admitting: Emergency Medicine

## 2020-07-14 ENCOUNTER — Other Ambulatory Visit: Payer: Self-pay

## 2020-07-14 DIAGNOSIS — M25561 Pain in right knee: Secondary | ICD-10-CM | POA: Diagnosis not present

## 2020-07-14 DIAGNOSIS — Y92410 Unspecified street and highway as the place of occurrence of the external cause: Secondary | ICD-10-CM | POA: Insufficient documentation

## 2020-07-14 DIAGNOSIS — S86911A Strain of unspecified muscle(s) and tendon(s) at lower leg level, right leg, initial encounter: Secondary | ICD-10-CM | POA: Diagnosis not present

## 2020-07-14 DIAGNOSIS — T148XXA Other injury of unspecified body region, initial encounter: Secondary | ICD-10-CM

## 2020-07-14 DIAGNOSIS — M79604 Pain in right leg: Secondary | ICD-10-CM

## 2020-07-14 DIAGNOSIS — W19XXXA Unspecified fall, initial encounter: Secondary | ICD-10-CM

## 2020-07-14 DIAGNOSIS — S8991XA Unspecified injury of right lower leg, initial encounter: Secondary | ICD-10-CM | POA: Diagnosis present

## 2020-07-14 DIAGNOSIS — M79651 Pain in right thigh: Secondary | ICD-10-CM | POA: Diagnosis not present

## 2020-07-14 MED ORDER — IBUPROFEN 800 MG PO TABS
800.0000 mg | ORAL_TABLET | Freq: Once | ORAL | Status: AC
  Administered 2020-07-14: 11:00:00 800 mg via ORAL
  Filled 2020-07-14: qty 1

## 2020-07-14 NOTE — ED Notes (Signed)
Ice pack provided to pt at this time. Crutches given and demonstration provided. Return demonstration by pt on correct use of crutches. Tolerated well. Stable gait noted.

## 2020-07-14 NOTE — Discharge Instructions (Addendum)
It was a pleasure meeting you today.  I am sorry that you injured your leg.  I think this is most likely a contusion to the bone versus bruise in the muscle.  Below is some information on this.  Regarding follow-up I recommend you be evaluated by the sports medicine clinic if the symptoms do not greatly improve.  Their information is in this handout and you can call to schedule an appointment.  For pain I want you to take 6-800 mg of ibuprofen 3 times daily as needed for the pain.  You can also use topical medications like Voltaren gel or Lidoderm patch.  If you have any questions or concerns please reach out to your PCP or be reevaluated in the emergency department.  I hope you have a wonderful afternoon!
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
## 2020-07-14 NOTE — ED Notes (Signed)
Able to stand up with provider and able to move right leg with mild difficulty.
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
## 2020-07-21 ENCOUNTER — Ambulatory Visit: Payer: Medicaid Other | Admitting: Family Medicine

## 2020-07-28 ENCOUNTER — Ambulatory Visit: Payer: Medicaid Other | Admitting: Family Medicine

## 2020-07-29 DIAGNOSIS — Z419 Encounter for procedure for purposes other than remedying health state, unspecified: Secondary | ICD-10-CM | POA: Diagnosis not present

## 2020-08-08 ENCOUNTER — Ambulatory Visit: Payer: Medicaid Other | Admitting: Family Medicine

## 2020-08-08 DIAGNOSIS — Z1152 Encounter for screening for COVID-19: Secondary | ICD-10-CM | POA: Diagnosis not present

## 2020-08-11 DIAGNOSIS — Z7251 High risk heterosexual behavior: Secondary | ICD-10-CM | POA: Diagnosis not present

## 2020-08-11 DIAGNOSIS — Z113 Encounter for screening for infections with a predominantly sexual mode of transmission: Secondary | ICD-10-CM | POA: Diagnosis not present

## 2020-08-11 DIAGNOSIS — R8290 Unspecified abnormal findings in urine: Secondary | ICD-10-CM | POA: Diagnosis not present

## 2020-08-29 DIAGNOSIS — Z419 Encounter for procedure for purposes other than remedying health state, unspecified: Secondary | ICD-10-CM | POA: Diagnosis not present

## 2020-09-29 DIAGNOSIS — Z419 Encounter for procedure for purposes other than remedying health state, unspecified: Secondary | ICD-10-CM | POA: Diagnosis not present

## 2020-10-29 DIAGNOSIS — Z419 Encounter for procedure for purposes other than remedying health state, unspecified: Secondary | ICD-10-CM | POA: Diagnosis not present

## 2020-11-29 DIAGNOSIS — Z419 Encounter for procedure for purposes other than remedying health state, unspecified: Secondary | ICD-10-CM | POA: Diagnosis not present

## 2020-12-29 DIAGNOSIS — Z419 Encounter for procedure for purposes other than remedying health state, unspecified: Secondary | ICD-10-CM | POA: Diagnosis not present

## 2021-01-15 ENCOUNTER — Ambulatory Visit: Payer: Medicaid Other

## 2021-01-29 DIAGNOSIS — Z419 Encounter for procedure for purposes other than remedying health state, unspecified: Secondary | ICD-10-CM | POA: Diagnosis not present

## 2021-03-01 DIAGNOSIS — Z419 Encounter for procedure for purposes other than remedying health state, unspecified: Secondary | ICD-10-CM | POA: Diagnosis not present

## 2021-03-29 DIAGNOSIS — Z419 Encounter for procedure for purposes other than remedying health state, unspecified: Secondary | ICD-10-CM | POA: Diagnosis not present

## 2021-04-29 DIAGNOSIS — Z419 Encounter for procedure for purposes other than remedying health state, unspecified: Secondary | ICD-10-CM | POA: Diagnosis not present

## 2021-05-20 DIAGNOSIS — Z202 Contact with and (suspected) exposure to infections with a predominantly sexual mode of transmission: Secondary | ICD-10-CM | POA: Diagnosis not present

## 2021-05-29 DIAGNOSIS — Z419 Encounter for procedure for purposes other than remedying health state, unspecified: Secondary | ICD-10-CM | POA: Diagnosis not present

## 2021-06-29 DIAGNOSIS — Z419 Encounter for procedure for purposes other than remedying health state, unspecified: Secondary | ICD-10-CM | POA: Diagnosis not present

## 2021-07-29 DIAGNOSIS — Z419 Encounter for procedure for purposes other than remedying health state, unspecified: Secondary | ICD-10-CM | POA: Diagnosis not present

## 2021-08-13 ENCOUNTER — Ambulatory Visit
Admission: EM | Admit: 2021-08-13 | Discharge: 2021-08-13 | Disposition: A | Discharge status: Home / Self Care | Payer: Medicaid Other | Attending: Physician Assistant | Admitting: Physician Assistant

## 2021-08-13 DIAGNOSIS — M542 Cervicalgia: Secondary | ICD-10-CM

## 2021-08-13 DIAGNOSIS — G44209 Tension-type headache, unspecified, not intractable: Secondary | ICD-10-CM | POA: Diagnosis not present

## 2021-08-13 MED ORDER — NAPROXEN 500 MG PO TABS: 500.0000 mg | ORAL_TABLET | Freq: Two times a day (BID) | ORAL | 0 refills | Status: DC

## 2021-08-13 MED ORDER — METHOCARBAMOL 500 MG PO TABS: 500.0000 mg | ORAL_TABLET | Freq: Three times a day (TID) | ORAL | 0 refills | Status: DC | PRN
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
## 2021-08-13 NOTE — Discharge Instructions (Signed)
Start Naprosyn twice daily.  Do not take NSAIDs with this medication including aspirin, ibuprofen/Advil, naproxen/Aleve.  Take Robaxin up to 3 times a day.  This to make you sleepy so do not drive or drink alcohol with taking it.  Use heat, rest, stretch for symptom relief.  If your symptoms are not proving quickly follow-up with sports medicine; call to schedule an appointment.  If you have any worsening symptoms including severe headache including the worst headache of your life, dizziness, nausea, vomiting, weakness, numbness/tingling in your arms you need to go to the emergency room.

## 2021-08-17 ENCOUNTER — Ambulatory Visit
Admission: EM | Admit: 2021-08-17 | Discharge: 2021-08-17 | Disposition: A | Discharge status: Home / Self Care | Payer: Medicaid Other | Attending: Internal Medicine | Admitting: Internal Medicine

## 2021-08-17 ENCOUNTER — Ambulatory Visit (HOSPITAL_COMMUNITY): Admission: EM | Admit: 2021-08-17 | Discharge status: Absent without leave | Payer: Medicaid Other

## 2021-08-17 ENCOUNTER — Encounter: Payer: Self-pay | Admitting: Emergency Medicine

## 2021-08-17 DIAGNOSIS — B9689 Other specified bacterial agents as the cause of diseases classified elsewhere: Secondary | ICD-10-CM

## 2021-08-17 DIAGNOSIS — H109 Unspecified conjunctivitis: Secondary | ICD-10-CM | POA: Diagnosis not present

## 2021-08-17 MED ORDER — ERYTHROMYCIN 5 MG/GM OP OINT: TOPICAL_OINTMENT | OPHTHALMIC | 0 refills | Status: AC

## 2021-08-17 NOTE — ED Triage Notes (Signed)
Pt is present today with bilateral eye irritation. Pt sx started x4 days ago

## 2021-08-17 NOTE — ED Provider Notes (Signed)
[facility]    CSN: [id number] Arrival date & time: 08/17/21  0930      History   Chief Complaint Chief Complaint  Patient presents with   Eye Pain    HPI Christian Gardner is a 21 y.o. male.   Patient presents with bilateral eye irritation, redness, itchiness that started about 4 days ago.  Denies any foreign body or trauma to the eye.  Patient reports that his cousin has similar symptoms but states that he has not been around him.  Denies any associated upper respiratory symptoms.  Patient has had watery drainage from the eyes.  Denies pain with extraocular movements.  He has used over-the-counter eyedrops with minimal improvement.  Patient reports he has intermittent blurry vision that feels like a film over his eye.  He does not wear contacts or glasses. Denies foreign body sensation.    Eye Pain    History reviewed. No pertinent past medical history.  There are no problems to display for this patient.   History reviewed. No pertinent surgical history.     Home Medications    Prior to Admission medications   Medication Sig Start Date End Date Taking? Authorizing Provider  erythromycin ophthalmic ointment Place a 1/2 inch ribbon of ointment into the lower eyelid 4 times daily for 7 days. 08/17/21  Yes Karlynn Furrow, Acie Fredrickson, FNP  methocarbamol (ROBAXIN) 500 MG tablet Take 1 tablet (500 mg total) by mouth every 8 (eight) hours as needed for muscle spasms. 08/13/21   Raspet, Noberto Retort, PA-C  naproxen (NAPROSYN) 500 MG tablet Take 1 tablet (500 mg total) by mouth 2 (two) times daily. 08/13/21   Raspet, Noberto Retort, PA-C    Family History Family History  Problem Relation Age of Onset   Healthy Mother    Healthy Father     Social History Social History   Tobacco Use   Smoking status: Never   Smokeless tobacco: Never  Vaping Use   Vaping Use: Never used  Substance Use Topics   Alcohol use: Yes    Comment: occasionally   Drug use: Yes    Types: Marijuana      Allergies   Patient has no known allergies.   Review of Systems Review of Systems Per HPI  Physical Exam Triage Vital Signs ED Triage Vitals [08/17/21 0939]  Enc Vitals Group     BP 129/73     Pulse Rate 62     Resp 18     Temp 98 F (36.7 C)     Temp src      SpO2 97 %     Weight      Height      Head Circumference      Peak Flow      Pain Score 6     Pain Loc      Pain Edu?      Excl. in GC?    No data found.  Updated Vital Signs BP 129/73   Pulse 62   Temp 98 F (36.7 C)   Resp 18   SpO2 97%   Visual Acuity Right Eye Distance: 20/25 Left Eye Distance: 20/25 Bilateral Distance: 20/20  Right Eye Near:   Left Eye Near:    Bilateral Near:     Physical Exam Constitutional:      General: He is not in acute distress.    Appearance: Normal appearance. He is not toxic-appearing or diaphoretic.  HENT:     Head: Normocephalic and  atraumatic.  Eyes:     General: Lids are normal. Lids are everted, no foreign bodies appreciated. Vision grossly intact. Gaze aligned appropriately.     Extraocular Movements: Extraocular movements intact.     Conjunctiva/sclera:     Right eye: Right conjunctiva is injected. Chemosis present. No exudate or hemorrhage.    Left eye: Left conjunctiva is injected. Chemosis present. No exudate or hemorrhage.    Pupils: Pupils are equal, round, and reactive to light.     Comments: Scleral redness throughout bilaterally.   Pulmonary:     Effort: Pulmonary effort is normal.  Neurological:     General: No focal deficit present.     Mental Status: He is alert and oriented to person, place, and time. Mental status is at baseline.  Psychiatric:        Mood and Affect: Mood normal.        Behavior: Behavior normal.        Thought Content: Thought content normal.        Judgment: Judgment normal.      UC Treatments / Results  Labs (all labs ordered are listed, but only abnormal results are displayed) Labs Reviewed - No data to  display  EKG   Radiology No results found.  Procedures Procedures (including critical care time)  Medications Ordered in UC Medications - No data to display  Initial Impression / Assessment and Plan / UC Course  I have reviewed the triage vital signs and the nursing notes.  Pertinent labs & imaging results that were available during my care of the patient were reviewed by me and considered in my medical decision making (see chart for details).     Physical exam is consistent with bacterial conjunctivitis.  Will treat with erythromycin ointment.  Will defer fluorescein stain given no foreign body sensation and no obvious trauma to the eye.  Visual acuity appears normal.  Advised patient to follow-up with eye doctor tomorrow to schedule appointment for further evaluation and management.  Discussed return precautions.  Patient verbalized understanding and was agreeable with plan. Final Clinical Impressions(s) / UC Diagnoses   Final diagnoses:  Bacterial conjunctivitis of both eyes     Discharge Instructions      I am suspicious that you have an infection of your eye so this is being treated with antibiotic ointment.  Please follow-up with eye doctor today to schedule appointment for further evaluation and management.    ED Prescriptions     Medication Sig Dispense Auth. Provider   erythromycin ophthalmic ointment Place a 1/2 inch ribbon of ointment into the lower eyelid 4 times daily for 7 days. 3.5 g Gustavus Bryant, [place]      PDMP not reviewed this encounter.   Gustavus Bryant, [place] 08/17/21 [phone]

## 2021-08-17 NOTE — Discharge Instructions (Signed)
I am suspicious that you have an infection of your eye so this is being treated with antibiotic ointment.  Please follow-up with eye doctor today to schedule appointment for further evaluation and management.

## 2021-08-28 ENCOUNTER — Other Ambulatory Visit (HOSPITAL_COMMUNITY): Payer: Self-pay

## 2021-08-28 ENCOUNTER — Ambulatory Visit (INDEPENDENT_AMBULATORY_CARE_PROVIDER_SITE_OTHER): Payer: Medicaid Other | Admitting: Family Medicine

## 2021-08-28 VITALS — BP 122/70 | Ht 73.0 in | Wt 180.0 lb

## 2021-08-28 DIAGNOSIS — M542 Cervicalgia: Secondary | ICD-10-CM

## 2021-08-28 DIAGNOSIS — S39012A Strain of muscle, fascia and tendon of lower back, initial encounter: Secondary | ICD-10-CM | POA: Diagnosis not present

## 2021-08-28 MED ORDER — METHOCARBAMOL 500 MG PO TABS
500.0000 mg | ORAL_TABLET | Freq: Three times a day (TID) | ORAL | 1 refills | Status: AC | PRN
  Filled 2021-08-28: qty 60, 20d supply, fill #0

## 2021-08-28 MED ORDER — NAPROXEN 500 MG PO TABS
500.0000 mg | ORAL_TABLET | Freq: Two times a day (BID) | ORAL | 1 refills | Status: AC
  Filled 2021-08-28: qty 60, 30d supply, fill #0
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
## 2021-08-28 NOTE — Patient Instructions (Addendum)
You have strains of the muscles of your neck and back. Take naproxen twice a day with food for pain and inflammation - can take as needed. Robaxin (methocarbamol) as needed for muscle pain/spasms - reserve for when you're not going to be driving or working. Heat 15 minutes at a time as needed. Start physical therapy, do home exercises on days you don't go to therapy. Follow up with me in 4 weeks.

## 2021-08-29 DIAGNOSIS — Z419 Encounter for procedure for purposes other than remedying health state, unspecified: Secondary | ICD-10-CM | POA: Diagnosis not present

## 2021-09-05 ENCOUNTER — Other Ambulatory Visit: Payer: Self-pay

## 2021-09-05 ENCOUNTER — Ambulatory Visit: Payer: Medicaid Other | Attending: Family Medicine

## 2021-09-05 DIAGNOSIS — M436 Torticollis: Secondary | ICD-10-CM | POA: Insufficient documentation

## 2021-09-05 DIAGNOSIS — M542 Cervicalgia: Secondary | ICD-10-CM | POA: Insufficient documentation
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
## 2021-09-15 ENCOUNTER — Ambulatory Visit: Payer: Medicaid Other

## 2021-09-22 ENCOUNTER — Ambulatory Visit: Payer: Medicaid Other

## 2021-09-22 DIAGNOSIS — M542 Cervicalgia: Secondary | ICD-10-CM | POA: Diagnosis not present

## 2021-09-22 DIAGNOSIS — M436 Torticollis: Secondary | ICD-10-CM

## 2021-09-22 NOTE — Therapy (Signed)
OUTPATIENT PHYSICAL THERAPY TREATMENT NOTE   Patient Name: Christian Gardner MRN: [id number] DOB:2001/01/01, 21 y.o., male Today's Date: 09/22/2021  PCP: Waldon Merl, PA-C REFERRING PROVIDER: Lenda Kelp, MD  END OF SESSION:   PT End of Session - 09/22/21 1410     Visit Number 2    Number of Visits 7    Date for PT Re-Evaluation 10/27/21    Authorization Type [place] MEDICAID [facility]    Authorization Time Period Approved 10 visits 09/06/21-11/05/21    Authorization - Visit Number 1    Authorization - Number of Visits 10    PT Start Time 1230    PT Stop Time 1314    PT Time Calculation (min) 44 min    Activity Tolerance Patient tolerated treatment well    Behavior During Therapy [facility] for tasks assessed/performed             History reviewed. No pertinent past medical history. History reviewed. No pertinent surgical history. There are no problems to display for this patient.   REFERRING DIAG: Neck pain  THERAPY DIAG:  Cervicalgia  Neck stiffness  Rationale for Evaluation and Treatment Rehabilitation  SUBJECTIVE:                                                                                                                                                                                                          SUBJECTIVE STATEMENT: Pt reports the exs have been helpful and he is able to look up and tilt his neck back further.   PAIN:  Are you having pain? Yes: NPRS scale: 3/10 Pain location: Base of neck Pain description: sharp, intermittent Aggravating factors: Looking up, sleeping on back Relieving factors: Medications, rest    PERTINENT HISTORY:  Not relevant   PRECAUTIONS: None   WEIGHT BEARING RESTRICTIONS No   FALLS:  Has patient fallen in last 6 months? No   LIVING ENVIRONMENT: Lives with: lives with their family Lives in: House/apartment No issues with accessing home or mobility within   OCCUPATION: Student   PLOF: Independent    PATIENT GOALS Relieve the pain   OBJECTIVE: (objective measures completed at initial evaluation unless otherwise dated)   DIAGNOSTIC FINDINGS:  No diagnostics   PATIENT SURVEYS:  NDI 8; 16%- Minimal limitations     COGNITION: Overall cognitive status: Within functional limits for tasks assessed     SENSATION: WFL   POSTURE: decreased thoracic kyphosis   PALPATION: TTP midline to the C7-T1 junction   CERVICAL ROM:    Active ROM A/PROM (deg) eval  Flexion 50  Extension 55 pain CT juntion  Right lateral flexion 40 tightness L  Left lateral flexion 40 tightness R  Right rotation 50  Left rotation 60   (Blank rows = not tested)   UPPER EXTREMITY ROM:            UE AROMs are WNLs     UPPER EXTREMITY MMT: UE MMTs are 5/5 and equal bil     CERVICAL SPECIAL TESTS:  Spurling's test: Negative and Distraction test: Positive     FUNCTIONAL TESTS:  NT   TODAY'S TREATMENT:  [facility] Adult PT Treatment:                                                DATE: 09/22/21 Therapeutic Exercise: Supine cervical retraction x10 3" Supine DNF x10 5" Seated Cervical retraction x10 3" Cervical rotation x3 20' Upper trap stretch x3 20" Standing open book x5 5" Shoulder abd star pattern x15 GTB Manual Therapy: STM to the upper trap, cervical paraspinals, and suboccipitals f/b MTPR to the upper traps  [facility] Adult PT Treatment:                                                DATE: 09/05/21 Therapeutic Exercise: Cervical retraction x10 3" Cervical rotation x5 20' Self Care: Sleeping positions for comfort   PATIENT EDUCATION:  Education details: Eval findings, POC, HEP, sleeping positions, hot shower or heating pad for 15 mins Person educated: Patient Education method: Explanation, Demonstration, Tactile cues, Verbal cues, and Handouts Education comprehension: verbalized understanding, returned demonstration, verbal cues required, and tactile cues required     HOME EXERCISE  PROGRAM: Access Code: [email or web address] URL: [email or web address] Date: 09/22/2021 Prepared by: Joellyn Rued  Exercises - Seated Passive Cervical Retraction  - 4-6 x daily - 7 x weekly - 1 sets - 3-5 reps - 3 hold - Standing Cervical Rotation AROM with Overpressure  - 4-6 x daily - 7 x weekly - 1 sets - 3-5 reps - 10 hold - Supine DNF Liftoffs  - 1-2 x daily - 7 x weekly - 1 sets - 10 reps - 5 hold - Seated Upper Trapezius Stretch  - 2 x daily - 7 x weekly - 1 sets - 3 reps - 10 hold - Standing Thoracic Open Book at Wall  - 1-2 x daily - 7 x weekly - 3 sets - 10 reps - 5 hold - Standing Shoulder Horizontal Abduction with Resistance  - 1-2 x daily - 7 x weekly - 2 sets - 10 reps - 2 hold   ASSESSMENT:   CLINICAL IMPRESSION: Pt returns to PT for the 1st time since the eval. He reports consistent completion of his HEP and improvement of the neck pain. Manual therapy c STM was provided and well as instruction and completion of therex for DNF and posterior chain strengthening, and cervical/upper body mobility. Pt is responding well to PT and will continue to benefit from skilled PT to address impairments for improved function with less pain.   OBJECTIVE IMPAIRMENTS decreased ROM, decreased strength, and pain.    ACTIVITY LIMITATIONS sleeping and reach over head   PARTICIPATION LIMITATIONS:  Looking up   PERSONAL FACTORS Time since onset of injury/illness/exacerbation  are also affecting patient's functional outcome.     GOALS:   SHORT TERM GOALS: Target date: 09/27/2021    Pt will be Ind in an initial HEP Baseline: started on eval Goal status: INITIAL   2.  Pt will voice understanding of measures to assist in the reduction of pain. Baseline: use of heat Goal status: INITIAL   LONG TERM GOALS: Target date: 10/27/21   Cervical ROMs will increase by 5d or more for improved neck mobility and function Baseline: see flow sheets Goal status: INITIAL   2.  Pt will report a decrease  in neck pain with looking up/extension to 2/10 or less for improved neck function and QOL Baseline: 7/10 Goal status: INITIAL   3.  Pt's NDI score will improve to 8% or less as indication of improved neck and and pain16% Baseline: 16% Goal status: INITIAL   4.  Pt willl be Ind in a final HEP to maintain achieved LOF Baseline: started on eval Goal status: INITIAL     PLAN: PT FREQUENCY: 1x/week   PT DURATION: 6 weeks   PLANNED INTERVENTIONS: Therapeutic exercises, Therapeutic activity, Patient/Family education, Self Care, Dry Needling, Electrical stimulation, Spinal manipulation, Spinal mobilization, Cryotherapy, Moist heat, Taping, Traction, Ionotophoresis 4mg /ml Dexamethasone, Manual therapy, and Re-evaluation   PLAN FOR NEXT SESSION: Assess response to HEP; progress there ex as indicated; use of modalities, manual therapy and TPDN as indicated.    , PT 09/22/2021, 2:34 PM

## 2021-09-25 ENCOUNTER — Ambulatory Visit: Payer: Medicaid Other | Admitting: Family Medicine

## 2021-09-29 ENCOUNTER — Ambulatory Visit: Payer: Medicaid Other | Attending: Family Medicine

## 2021-09-29 ENCOUNTER — Telehealth: Payer: Self-pay

## 2021-09-29 DIAGNOSIS — Z419 Encounter for procedure for purposes other than remedying health state, unspecified: Secondary | ICD-10-CM | POA: Diagnosis not present

## 2021-09-29 DIAGNOSIS — M436 Torticollis: Secondary | ICD-10-CM | POA: Insufficient documentation

## 2021-09-29 DIAGNOSIS — M542 Cervicalgia: Secondary | ICD-10-CM | POA: Insufficient documentation

## 2021-09-29 NOTE — Telephone Encounter (Signed)
Spoke to pt about a no show (1st) visit for today. Pt stated he forgot about the appt. Pt was reminded of his upcoming appt next week.

## 2021-10-04 ENCOUNTER — Ambulatory Visit: Payer: Medicaid Other

## 2021-10-04 DIAGNOSIS — M436 Torticollis: Secondary | ICD-10-CM

## 2021-10-04 DIAGNOSIS — M542 Cervicalgia: Secondary | ICD-10-CM

## 2021-10-04 NOTE — Therapy (Signed)
OUTPATIENT PHYSICAL THERAPY TREATMENT NOTE   Patient Name: Christian Gardner MRN: [id number] DOB:19-Sep-2000, 21 y.o., male Today's Date: 10/04/2021  PCP: Brunetta Jeans, PA-C REFERRING PROVIDER: Dene Gentry, MD  END OF SESSION:   PT End of Session - 10/04/21 1515     Visit Number 3    Number of Visits 7    Authorization Type [place] Time Period Approved 10 visits 09/06/21-11/05/21    Authorization - Visit Number 2    Authorization - Number of Visits 10    PT Start Time 5102    PT Stop Time 1502    PT Time Calculation (min) 40 min    Activity Tolerance Patient tolerated treatment well    Behavior During Therapy [facility] for tasks assessed/performed              History reviewed. No pertinent past medical history. History reviewed. No pertinent surgical history. There are no problems to display for this patient.   REFERRING DIAG: Neck pain  THERAPY DIAG:  Cervicalgia  Neck stiffness  Rationale for Evaluation and Treatment Rehabilitation  SUBJECTIVE:                                                                                                                                                                                                          SUBJECTIVE STATEMENT: Pt reports since the last PT session he has not been experiencing neck/upper shoulder pain.   PAIN:  Are you having pain? Yes: NPRS scale: 0/10 Pain location: Base of neck Pain description: sharp, intermittent Aggravating factors: Looking up, sleeping on back Relieving factors: Medications, rest    PERTINENT HISTORY:  Not relevant   PRECAUTIONS: None   WEIGHT BEARING RESTRICTIONS No   FALLS:  Has patient fallen in last 6 months? No   LIVING ENVIRONMENT: Lives with: lives with their family Lives in: House/apartment No issues with accessing home or mobility within   OCCUPATION: Student   PLOF: Independent   PATIENT GOALS Relieve the pain   OBJECTIVE:  (objective measures completed at initial evaluation unless otherwise dated)   DIAGNOSTIC FINDINGS:  No diagnostics   PATIENT SURVEYS:  NDI 8; 16%- Minimal limitations. 10/04/21=0%     COGNITION: Overall cognitive status: Within functional limits for tasks assessed     SENSATION: WFL   POSTURE: decreased thoracic kyphosis   PALPATION: TTP midline to the C7-T1 junction   CERVICAL ROM:    Active ROM A/PROM (deg) eval AROM 10/04/21  Flexion 50 58  Extension 55 pain CT juntion  63 pressure at end range  Right lateral flexion 40 tightness L 47  Left lateral flexion 40 tightness R 48  Right rotation 50 64  Left rotation 60 71   (Blank rows = not tested)   UPPER EXTREMITY ROM:            UE AROMs are WNLs     UPPER EXTREMITY MMT: UE MMTs are 5/5 and equal bil     CERVICAL SPECIAL TESTS:  Spurling's test: Negative and Distraction test: Positive     FUNCTIONAL TESTS:  NT   TODAY'S TREATMENT:  [facility] Adult PT Treatment:                                                DATE: 10/04/21 Therapeutic Exercise: Supine cervical retraction x5 3" Supine DNF x10 5" Seated Cervical retraction x10 3" Cervical rotation x2 20' Upper trap stretch x2 20" Standing open book x5 5" Shoulder abd star pattern 2x5 patterns GTB Review HEP Manual Therapy: STM to the upper trap, cervical paraspinals, and suboccipitals c MTPR to the upper traps  [facility] Adult PT Treatment:                                                DATE: 09/22/21 Therapeutic Exercise: Supine cervical retraction x10 3" Supine DNF x10 5" Seated Cervical retraction x10 3" Cervical rotation x3 20' Upper trap stretch x3 20" Standing open book x5 5" Shoulder abd star pattern x15 GTB Manual Therapy: STM to the upper trap, cervical paraspinals, and suboccipitals f/b MTPR to the upper traps  [facility] Adult PT Treatment:                                                DATE: 09/05/21 Therapeutic Exercise: Cervical retraction x10  3" Cervical rotation x5 20' Self Care: Sleeping positions for comfort   PATIENT EDUCATION:  Education details: Eval findings, POC, HEP, sleeping positions, hot shower or heating pad for 15 mins Person educated: Patient Education method: Explanation, Demonstration, Tactile cues, Verbal cues, and Handouts Education comprehension: verbalized understanding, returned demonstration, verbal cues required, and tactile cues required     HOME EXERCISE PROGRAM: Access Code: [email or web address] URL: [email or web address] Date: 09/22/2021 Prepared by: Gar Ponto  Exercises - Seated Passive Cervical Retraction  - 4-6 x daily - 7 x weekly - 1 sets - 3-5 reps - 3 hold - Standing Cervical Rotation AROM with Overpressure  - 4-6 x daily - 7 x weekly - 1 sets - 3-5 reps - 10 hold - Supine DNF Liftoffs  - 1-2 x daily - 7 x weekly - 1 sets - 10 reps - 5 hold - Seated Upper Trapezius Stretch  - 2 x daily - 7 x weekly - 1 sets - 3 reps - 10 hold - Standing Thoracic Open Book at [place]  - 1-2 x daily - 7 x weekly - 3 sets - 10 reps - 5 hold - Standing Shoulder Horizontal Abduction with Resistance  - 1-2 x daily - 7 x weekly - 2 sets - 10 reps - 2 hold   ASSESSMENT:  CLINICAL IMPRESSION: Pt presents to PT reporting continued improvement in neck pain with his not experiencing pain since the last PT session. Reassessment revealed improved cervical ROM and an improved NDI score, meeting the goals for both of these areas. Pt's HEP was reviewed with pt demonstrating proper return. Pt report consistent completion of his HEP. Pt is DC from PT services with goals met.   OBJECTIVE IMPAIRMENTS decreased ROM, decreased strength, and pain.    ACTIVITY LIMITATIONS sleeping and reach over head   PARTICIPATION LIMITATIONS:  Looking up   PERSONAL FACTORS Time since onset of injury/illness/exacerbation are also affecting patient's functional outcome.     GOALS:   SHORT TERM GOALS: Target date: 09/27/2021    Pt  will be Ind in an initial HEP Baseline: started on eval Goal status: MET   2.  Pt will voice understanding of measures to assist in the reduction of pain. Baseline: use of heat Goal status: MET   LONG TERM GOALS: Target date: 10/27/21   Cervical ROMs will increase by 5d or more for improved neck mobility and function Baseline: see flow sheets Status: see flow sheet Goal status: MET   2.  Pt will report a decrease in neck pain with looking up/extension to 2/10 or less for improved neck function and QOL Baseline: 7/10 Status: 0/10 Goal status: MET   3.  Pt's NDI score will improve to 8% or less as indication of improved neck and and pain16% Baseline: 16% Status: 0% Goal status: MET   4.  Pt willl be Ind in a final HEP to maintain achieved LOF Baseline: started on eval Goal status: MET     PLAN: PT FREQUENCY: 1x/week   PT DURATION: 6 weeks   PLANNED INTERVENTIONS: Therapeutic exercises, Therapeutic activity, Patient/Family education, Self Care, Dry Needling, Electrical stimulation, Spinal manipulation, Spinal mobilization, Cryotherapy, Moist heat, Taping, Traction, Ionotophoresis 25m/ml Dexamethasone, Manual therapy, and Re-evaluation   PLAN FOR NEXT SESSION:   PHYSICAL THERAPY DISCHARGE SUMMARY  Visits from Start of Care: 3  Current functional level related to goals / functional outcomes: See clinical impression and goals   Remaining deficits: See clinical impression and goals   Education / Equipment: HEP   Patient agrees to discharge. Patient goals were met. Patient is being discharged due to being pleased with the current functional level.

## 2021-10-29 DIAGNOSIS — Z419 Encounter for procedure for purposes other than remedying health state, unspecified: Secondary | ICD-10-CM | POA: Diagnosis not present

## 2021-11-29 DIAGNOSIS — Z419 Encounter for procedure for purposes other than remedying health state, unspecified: Secondary | ICD-10-CM | POA: Diagnosis not present

## 2021-12-05 DIAGNOSIS — R300 Dysuria: Secondary | ICD-10-CM | POA: Diagnosis not present

## 2021-12-05 DIAGNOSIS — Z113 Encounter for screening for infections with a predominantly sexual mode of transmission: Secondary | ICD-10-CM | POA: Diagnosis not present

## 2021-12-14 DIAGNOSIS — Z202 Contact with and (suspected) exposure to infections with a predominantly sexual mode of transmission: Secondary | ICD-10-CM | POA: Diagnosis not present

## 2021-12-29 DIAGNOSIS — Z419 Encounter for procedure for purposes other than remedying health state, unspecified: Secondary | ICD-10-CM | POA: Diagnosis not present

## 2022-01-29 DIAGNOSIS — Z419 Encounter for procedure for purposes other than remedying health state, unspecified: Secondary | ICD-10-CM | POA: Diagnosis not present

## 2022-02-02 DIAGNOSIS — Z23 Encounter for immunization: Secondary | ICD-10-CM | POA: Diagnosis not present

## 2022-02-02 DIAGNOSIS — R030 Elevated blood-pressure reading, without diagnosis of hypertension: Secondary | ICD-10-CM | POA: Diagnosis not present

## 2022-02-02 DIAGNOSIS — Z0000 Encounter for general adult medical examination without abnormal findings: Secondary | ICD-10-CM | POA: Diagnosis not present

## 2022-02-02 DIAGNOSIS — Z131 Encounter for screening for diabetes mellitus: Secondary | ICD-10-CM | POA: Diagnosis not present

## 2022-02-02 DIAGNOSIS — Z136 Encounter for screening for cardiovascular disorders: Secondary | ICD-10-CM | POA: Diagnosis not present

## 2022-02-02 DIAGNOSIS — Z13220 Encounter for screening for lipoid disorders: Secondary | ICD-10-CM | POA: Diagnosis not present

## 2022-03-01 DIAGNOSIS — Z419 Encounter for procedure for purposes other than remedying health state, unspecified: Secondary | ICD-10-CM | POA: Diagnosis not present

## 2022-03-30 DIAGNOSIS — Z419 Encounter for procedure for purposes other than remedying health state, unspecified: Secondary | ICD-10-CM | POA: Diagnosis not present

## 2022-04-30 DIAGNOSIS — Z419 Encounter for procedure for purposes other than remedying health state, unspecified: Secondary | ICD-10-CM | POA: Diagnosis not present

## 2022-05-30 DIAGNOSIS — Z419 Encounter for procedure for purposes other than remedying health state, unspecified: Secondary | ICD-10-CM | POA: Diagnosis not present

## 2022-06-30 DIAGNOSIS — Z419 Encounter for procedure for purposes other than remedying health state, unspecified: Secondary | ICD-10-CM | POA: Diagnosis not present

## 2022-07-30 DIAGNOSIS — Z419 Encounter for procedure for purposes other than remedying health state, unspecified: Secondary | ICD-10-CM | POA: Diagnosis not present

## 2022-08-17 DIAGNOSIS — Z113 Encounter for screening for infections with a predominantly sexual mode of transmission: Secondary | ICD-10-CM | POA: Diagnosis not present

## 2022-08-17 DIAGNOSIS — Z1159 Encounter for screening for other viral diseases: Secondary | ICD-10-CM | POA: Diagnosis not present

## 2022-08-17 DIAGNOSIS — R030 Elevated blood-pressure reading, without diagnosis of hypertension: Secondary | ICD-10-CM | POA: Diagnosis not present

## 2022-08-17 DIAGNOSIS — Z202 Contact with and (suspected) exposure to infections with a predominantly sexual mode of transmission: Secondary | ICD-10-CM | POA: Diagnosis not present

## 2022-08-17 DIAGNOSIS — Z114 Encounter for screening for human immunodeficiency virus [HIV]: Secondary | ICD-10-CM | POA: Diagnosis not present

## 2022-08-30 DIAGNOSIS — Z419 Encounter for procedure for purposes other than remedying health state, unspecified: Secondary | ICD-10-CM | POA: Diagnosis not present

## 2022-09-30 DIAGNOSIS — Z419 Encounter for procedure for purposes other than remedying health state, unspecified: Secondary | ICD-10-CM | POA: Diagnosis not present

## 2022-11-30 DIAGNOSIS — Z419 Encounter for procedure for purposes other than remedying health state, unspecified: Secondary | ICD-10-CM | POA: Diagnosis not present

## 2022-12-19 DIAGNOSIS — Z113 Encounter for screening for infections with a predominantly sexual mode of transmission: Secondary | ICD-10-CM | POA: Diagnosis not present

## 2022-12-30 DIAGNOSIS — Z419 Encounter for procedure for purposes other than remedying health state, unspecified: Secondary | ICD-10-CM | POA: Diagnosis not present

## 2023-01-01 DIAGNOSIS — S61211A Laceration without foreign body of left index finger without damage to nail, initial encounter: Secondary | ICD-10-CM | POA: Diagnosis not present

## 2023-01-01 DIAGNOSIS — R051 Acute cough: Secondary | ICD-10-CM | POA: Diagnosis not present

## 2023-01-30 DIAGNOSIS — Z419 Encounter for procedure for purposes other than remedying health state, unspecified: Secondary | ICD-10-CM | POA: Diagnosis not present

## 2023-02-07 DIAGNOSIS — Z114 Encounter for screening for human immunodeficiency virus [HIV]: Secondary | ICD-10-CM | POA: Diagnosis not present

## 2023-02-07 DIAGNOSIS — Z0000 Encounter for general adult medical examination without abnormal findings: Secondary | ICD-10-CM | POA: Diagnosis not present

## 2023-02-07 DIAGNOSIS — Z1330 Encounter for screening examination for mental health and behavioral disorders, unspecified: Secondary | ICD-10-CM | POA: Diagnosis not present

## 2023-02-07 DIAGNOSIS — R7303 Prediabetes: Secondary | ICD-10-CM | POA: Diagnosis not present

## 2023-02-07 DIAGNOSIS — Z113 Encounter for screening for infections with a predominantly sexual mode of transmission: Secondary | ICD-10-CM | POA: Diagnosis not present

## 2023-02-07 DIAGNOSIS — E7800 Pure hypercholesterolemia, unspecified: Secondary | ICD-10-CM | POA: Diagnosis not present

## 2023-02-07 DIAGNOSIS — Z1159 Encounter for screening for other viral diseases: Secondary | ICD-10-CM | POA: Diagnosis not present

## 2023-02-07 DIAGNOSIS — Z832 Family history of diseases of the blood and blood-forming organs and certain disorders involving the immune mechanism: Secondary | ICD-10-CM | POA: Diagnosis not present

## 2023-02-07 DIAGNOSIS — Z23 Encounter for immunization: Secondary | ICD-10-CM | POA: Diagnosis not present

## 2023-02-07 DIAGNOSIS — Z1331 Encounter for screening for depression: Secondary | ICD-10-CM | POA: Diagnosis not present

## 2023-03-02 DIAGNOSIS — Z419 Encounter for procedure for purposes other than remedying health state, unspecified: Secondary | ICD-10-CM | POA: Diagnosis not present

## 2023-03-12 DIAGNOSIS — A493 Mycoplasma infection, unspecified site: Secondary | ICD-10-CM | POA: Diagnosis not present

## 2023-03-28 DIAGNOSIS — A6001 Herpesviral infection of penis: Secondary | ICD-10-CM | POA: Diagnosis not present

## 2023-03-28 DIAGNOSIS — R140 Abdominal distension (gaseous): Secondary | ICD-10-CM | POA: Diagnosis not present

## 2023-03-28 DIAGNOSIS — M545 Low back pain, unspecified: Secondary | ICD-10-CM | POA: Diagnosis not present

## 2023-03-28 DIAGNOSIS — R198 Other specified symptoms and signs involving the digestive system and abdomen: Secondary | ICD-10-CM | POA: Diagnosis not present

## 2023-03-30 DIAGNOSIS — Z419 Encounter for procedure for purposes other than remedying health state, unspecified: Secondary | ICD-10-CM | POA: Diagnosis not present

## 2023-04-09 DIAGNOSIS — Z113 Encounter for screening for infections with a predominantly sexual mode of transmission: Secondary | ICD-10-CM | POA: Diagnosis not present

## 2023-04-09 DIAGNOSIS — A493 Mycoplasma infection, unspecified site: Secondary | ICD-10-CM | POA: Diagnosis not present

## 2023-04-22 DIAGNOSIS — A493 Mycoplasma infection, unspecified site: Secondary | ICD-10-CM | POA: Diagnosis not present

## 2023-05-10 IMAGING — DX DG KNEE COMPLETE 4+V*R*
4 series · 4 of 4 positions shown · non-contrast
Comparison: None.

CLINICAL DATA: Pain following fall

EXAM:
RIGHT KNEE - COMPLETE 4+ VIEW

[knee ap]
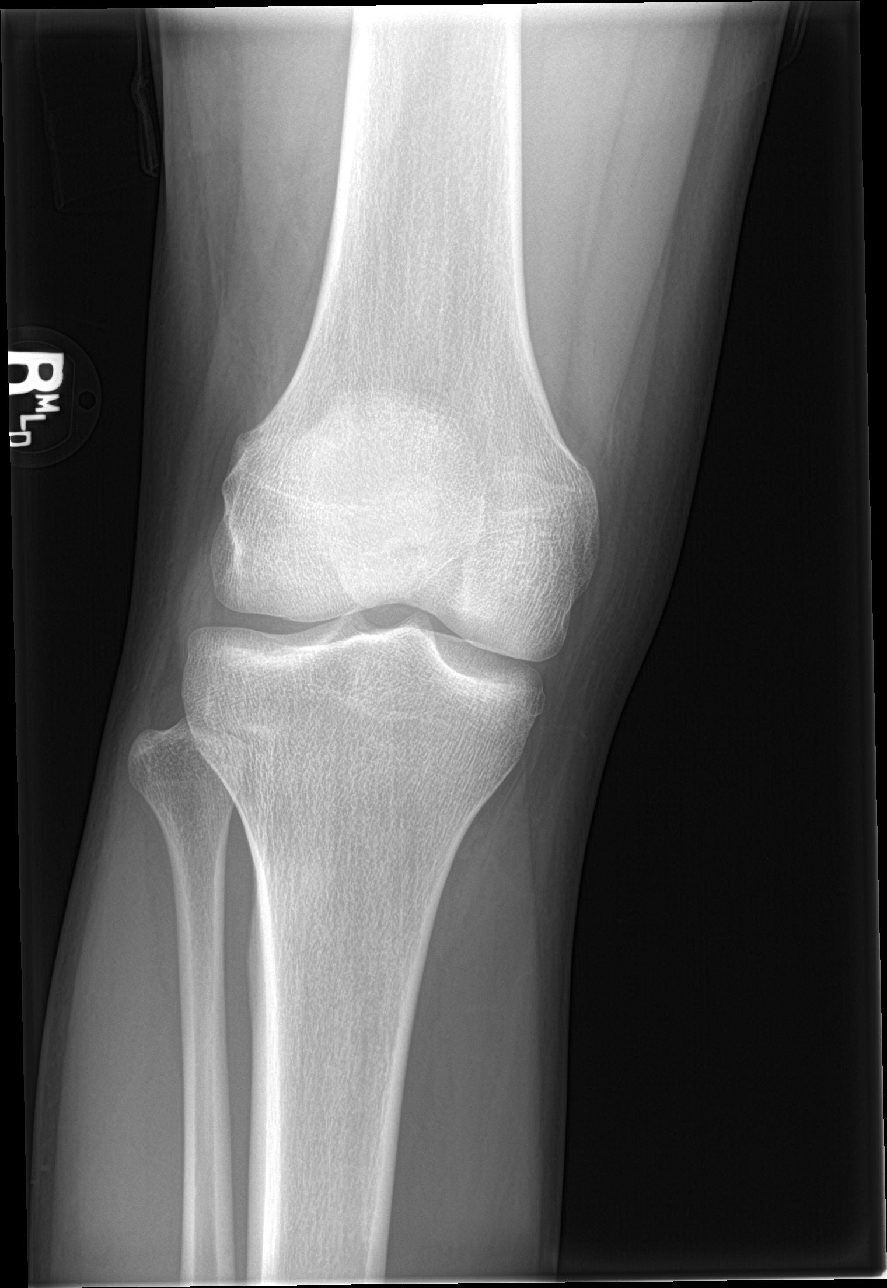

[knee lat]
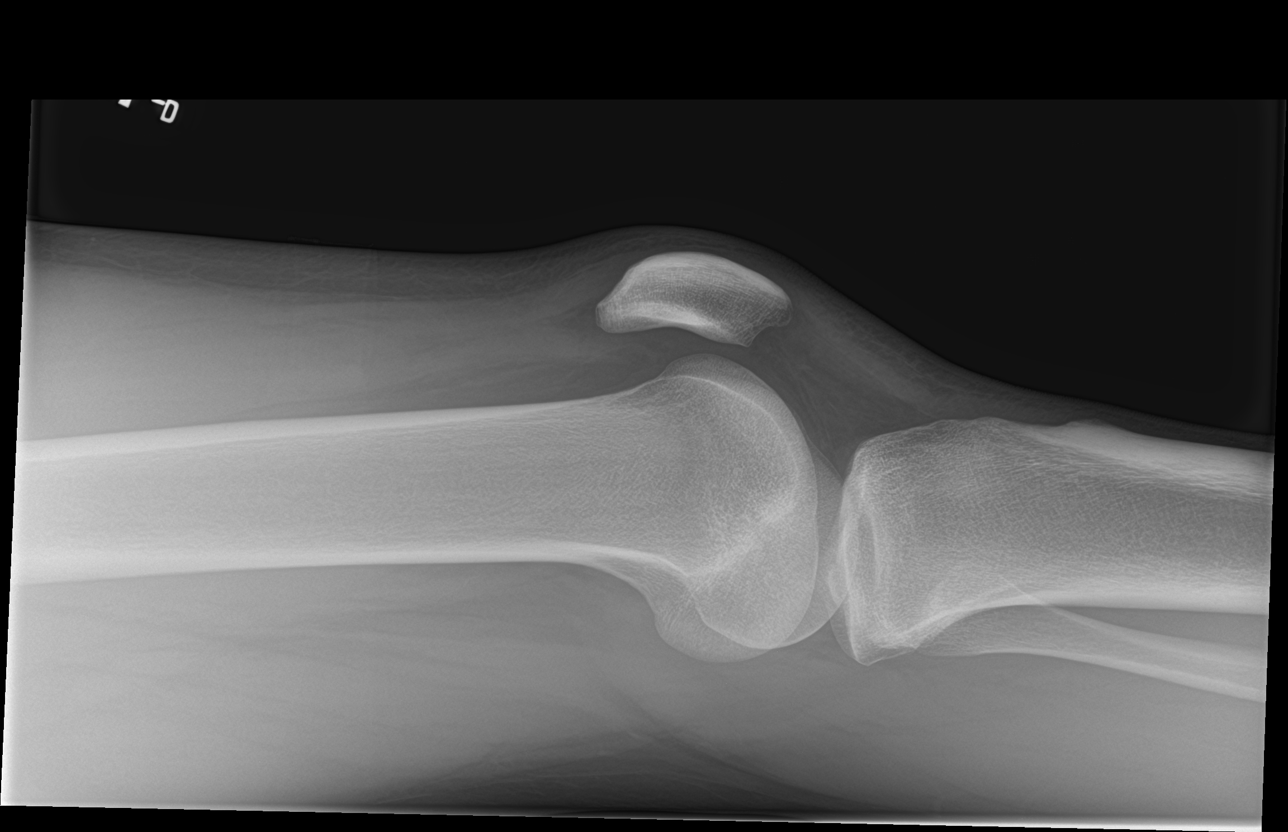

[knee obl (1 of 2)]
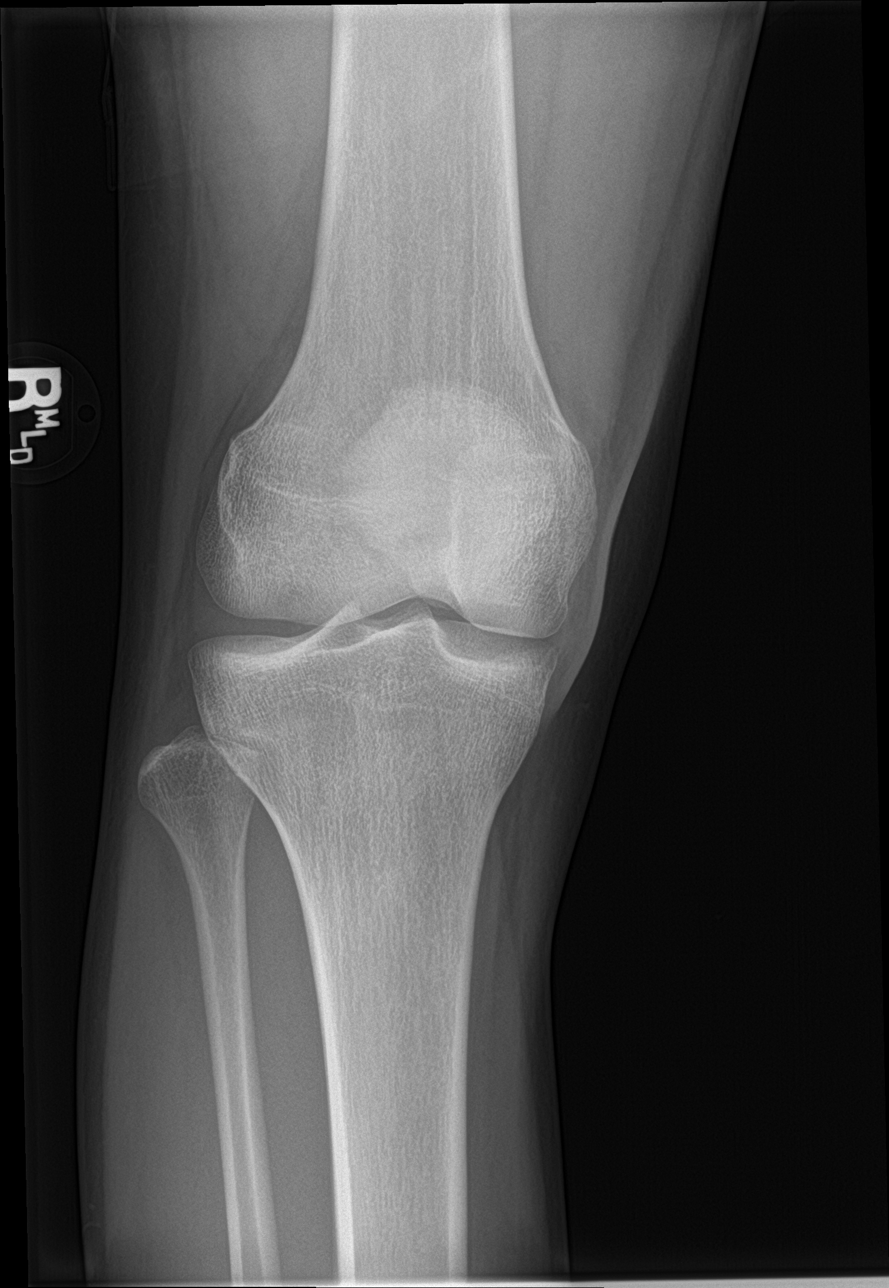

[knee obl (2 of 2)]
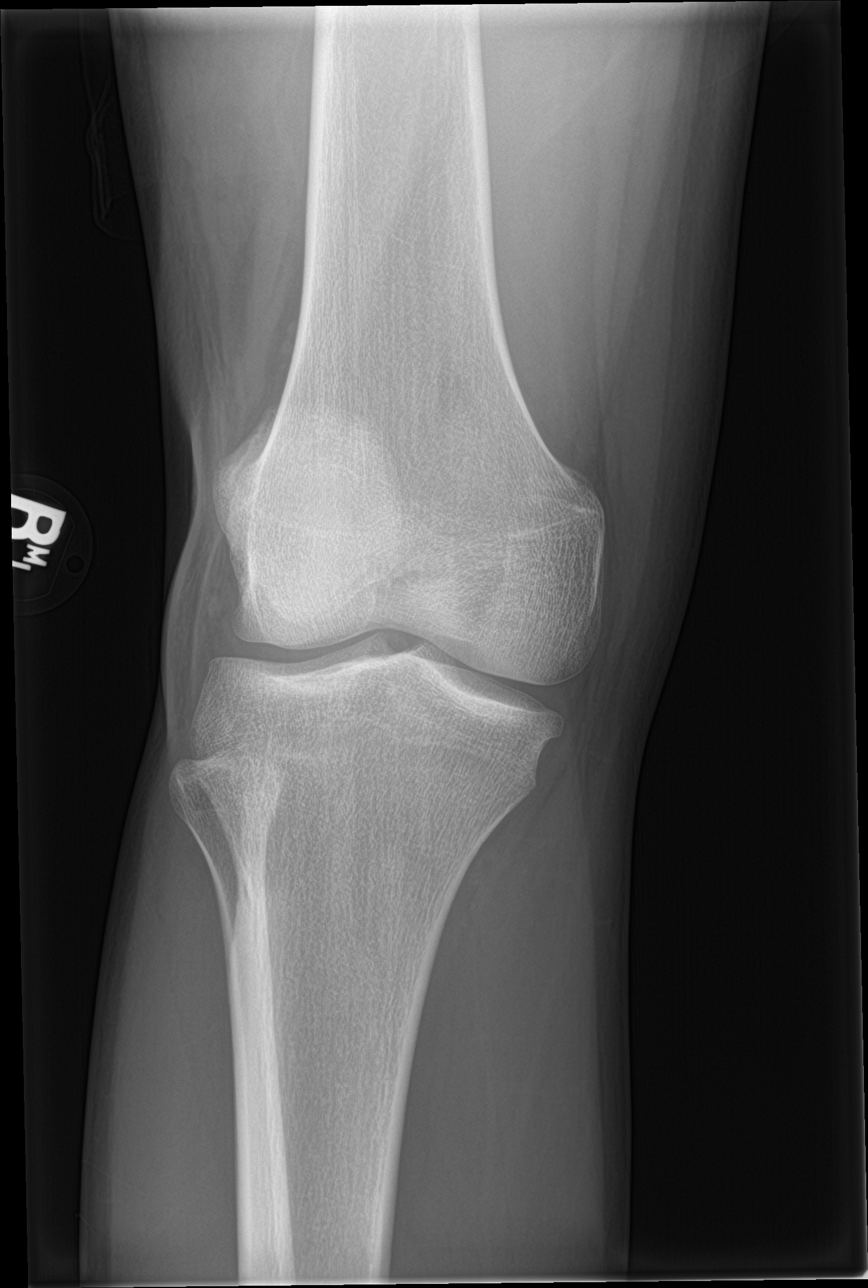

[4 of 4 positions shown; findings below may reference images not displayed]

FINDINGS: Frontal, lateral, and bilateral oblique views were obtained. No
fracture or dislocation. No appreciable joint effusion. Joint spaces
appear normal. No erosive change.
IMPRESSION: No fracture, dislocation, or joint effusion. No appreciable
arthropathic change.

## 2023-05-10 IMAGING — DX DG FEMUR 2+V*R*
4 series · 4 of 4 positions shown · non-contrast
Comparison: None.

CLINICAL DATA: Pain following fall

EXAM:
RIGHT FEMUR 2 VIEWS

[femur ap (1 of 2)]
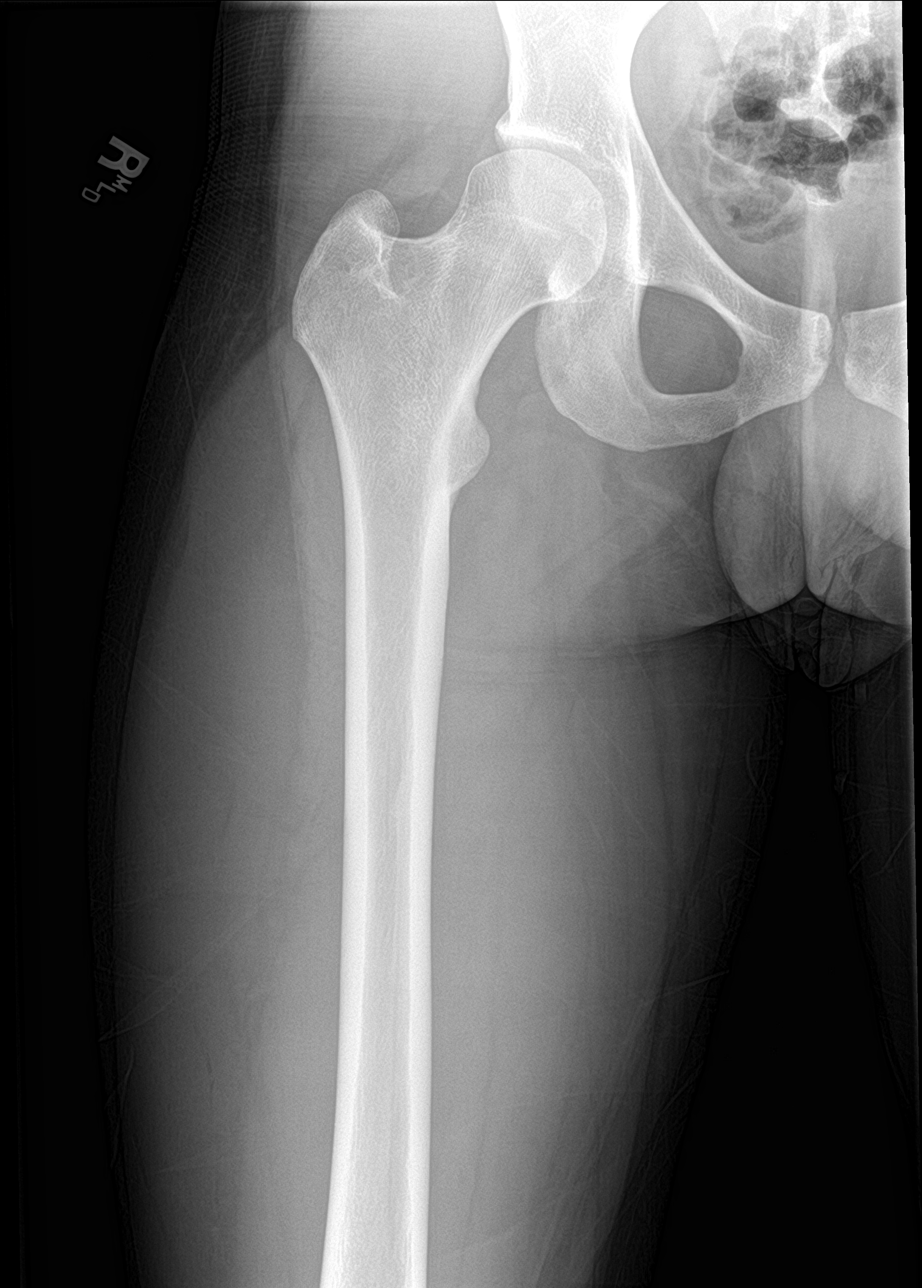

[femur ap (2 of 2)]
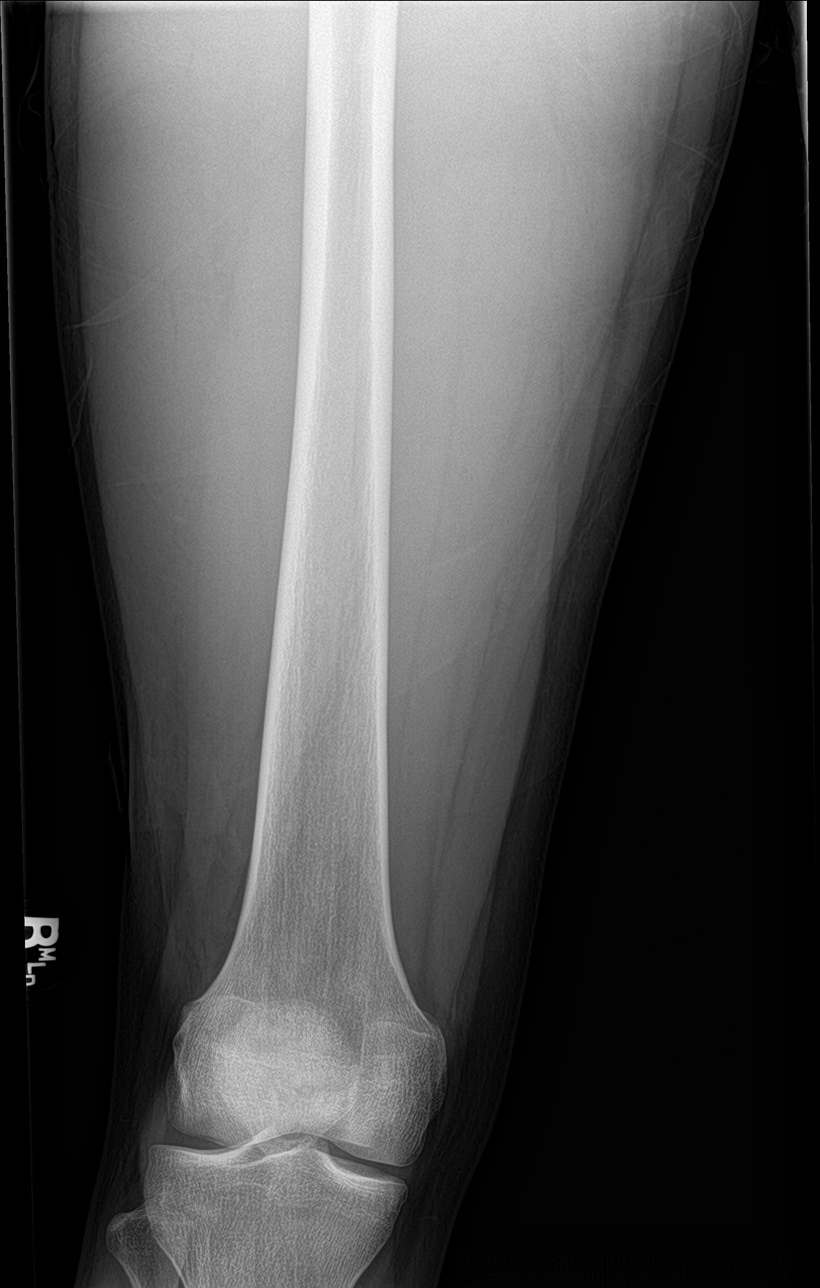

[femur lat (1 of 2)]
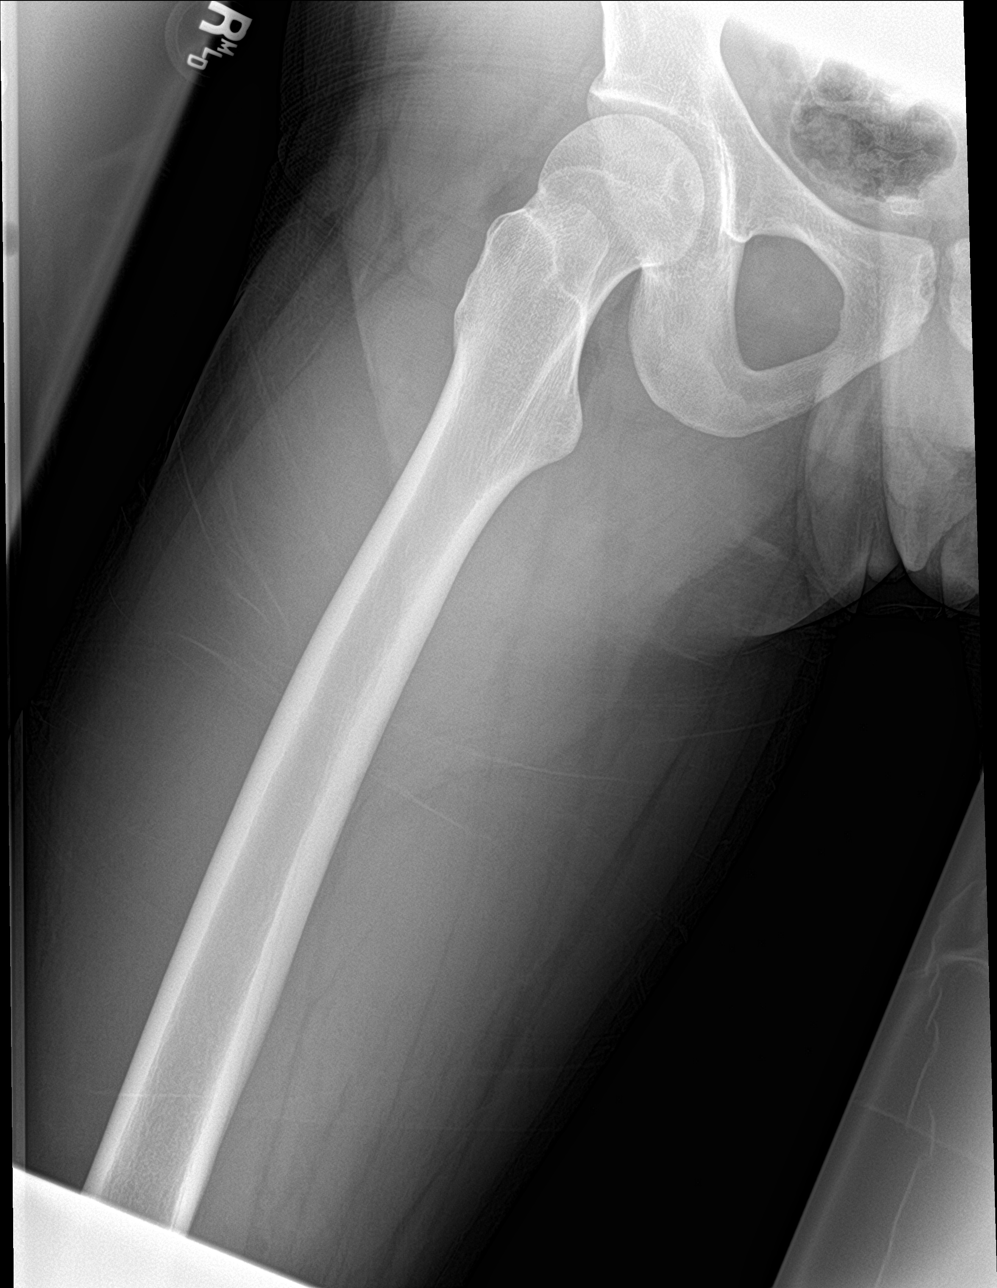

[femur lat (2 of 2)]
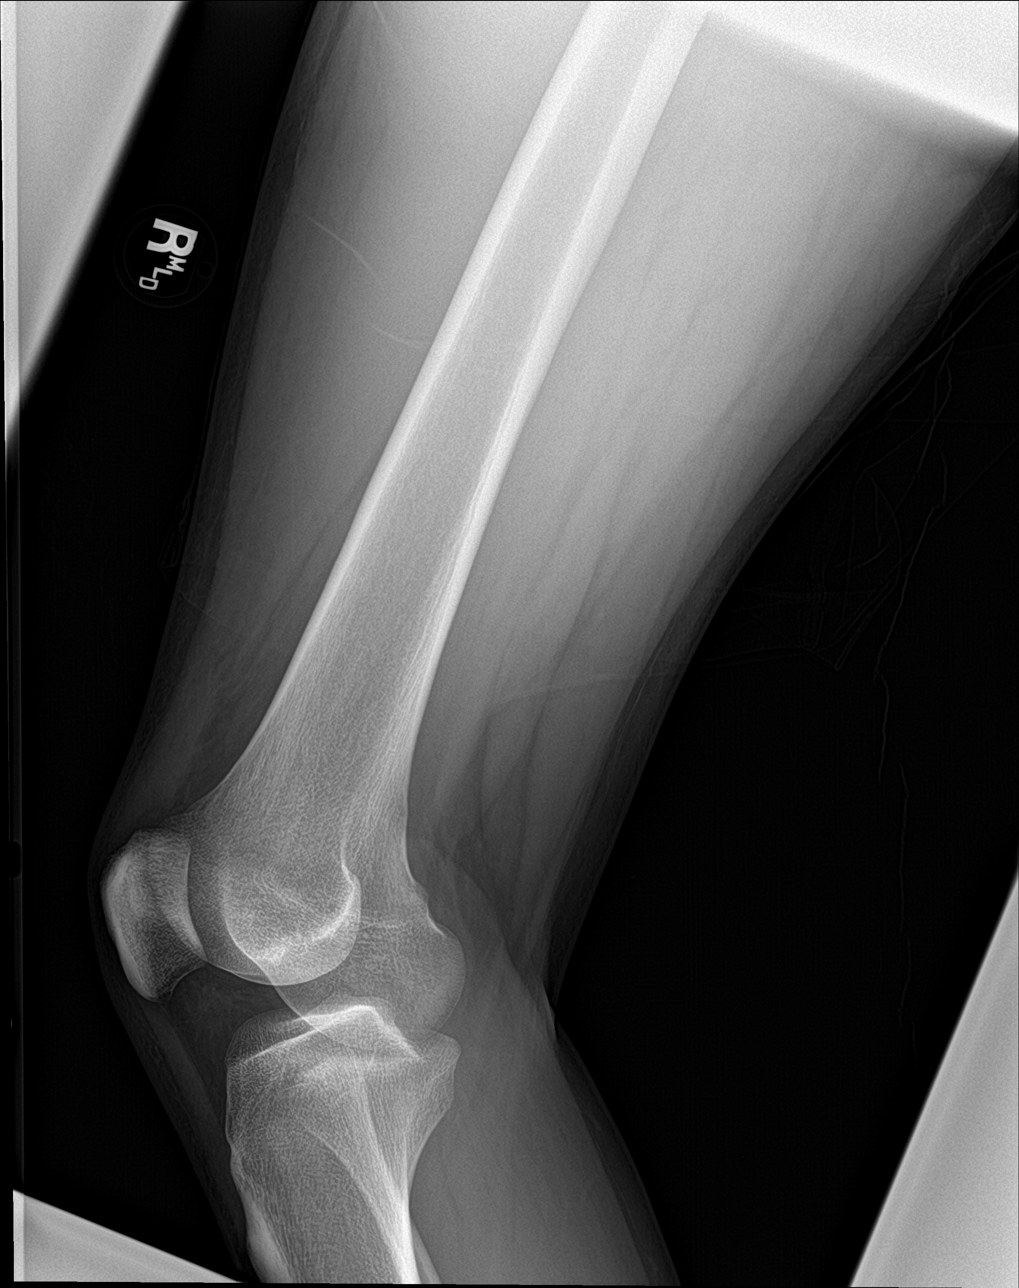

[4 of 4 positions shown; findings below may reference images not displayed]

FINDINGS: Frontal and lateral views were obtained. No fracture or dislocation.
No abnormal periosteal reaction. Joint spaces appear normal. No knee
joint effusion.
IMPRESSION: No fracture or dislocation.  No evident arthropathy.

## 2023-05-11 DIAGNOSIS — Z419 Encounter for procedure for purposes other than remedying health state, unspecified: Secondary | ICD-10-CM | POA: Diagnosis not present

## 2023-06-10 DIAGNOSIS — Z419 Encounter for procedure for purposes other than remedying health state, unspecified: Secondary | ICD-10-CM | POA: Diagnosis not present

## 2023-07-03 DIAGNOSIS — Z1159 Encounter for screening for other viral diseases: Secondary | ICD-10-CM | POA: Diagnosis not present

## 2023-07-03 DIAGNOSIS — Z113 Encounter for screening for infections with a predominantly sexual mode of transmission: Secondary | ICD-10-CM | POA: Diagnosis not present

## 2023-07-03 DIAGNOSIS — B009 Herpesviral infection, unspecified: Secondary | ICD-10-CM | POA: Diagnosis not present

## 2023-07-11 DIAGNOSIS — Z419 Encounter for procedure for purposes other than remedying health state, unspecified: Secondary | ICD-10-CM | POA: Diagnosis not present

## 2023-08-10 DIAGNOSIS — Z419 Encounter for procedure for purposes other than remedying health state, unspecified: Secondary | ICD-10-CM | POA: Diagnosis not present

## 2023-09-10 DIAGNOSIS — Z419 Encounter for procedure for purposes other than remedying health state, unspecified: Secondary | ICD-10-CM | POA: Diagnosis not present

## 2023-10-02 ENCOUNTER — Ambulatory Visit
Admission: EM | Admit: 2023-10-02 | Discharge: 2023-10-02 | Disposition: A | Discharge status: Home / Self Care | Attending: Emergency Medicine | Admitting: Emergency Medicine

## 2023-10-02 ENCOUNTER — Encounter: Payer: Self-pay | Admitting: Emergency Medicine

## 2023-10-02 DIAGNOSIS — L02411 Cutaneous abscess of right axilla: Secondary | ICD-10-CM | POA: Diagnosis not present

## 2023-10-02 DIAGNOSIS — L02412 Cutaneous abscess of left axilla: Secondary | ICD-10-CM

## 2023-10-02 MED ORDER — DOXYCYCLINE HYCLATE 100 MG PO CAPS: 100.0000 mg | ORAL_CAPSULE | Freq: Two times a day (BID) | ORAL | 0 refills | Status: AC

## 2023-10-02 MED ORDER — ACETAMINOPHEN-CODEINE 300-30 MG PO TABS
1.0000 | ORAL_TABLET | Freq: Four times a day (QID) | ORAL | 0 refills | Status: AC | PRN
Start: 2023-10-02 — End: ?

## 2023-10-02 NOTE — Discharge Instructions (Signed)
 Due to the firmness of your abscess and we felt off on attempting to drain them and most likely will you will minimal results  Take doxycycline  twice daily for 7 days  For severe pain you may use Tylenol  3 which has codeine  every 6 hours, be mindful this can make you feel drowsy  Hold warm-hot compresses to affected area at least 4 times a day, this helps to facilitate draining, the more the better  Please return for evaluation for increased swelling, increased tenderness or pain, non healing site, non draining site, you begin to have fever or chills   We reviewed the etiology of recurrent abscesses of skin.  Skin abscesses are collections of pus within the dermis and deeper skin tissues. Skin abscesses manifest as painful, tender, fluctuant, and erythematous nodules, frequently surmounted by a pustule and surrounded by a rim of erythematous swelling.  Spontaneous drainage of purulent material may occur.  Fever can occur on occasion.    -Skin abscesses can develop in healthy individuals with no predisposing conditions other than skin or nasal carriage of Staphylococcus aureus.  Individuals in close contact with others who have active infection with skin abscesses are at increased risk which is likely to explain why twin brother has similar episodes.   In addition, any process leading to a breach in the skin barrier can also predispose to the development of a skin abscesses, such as atopic dermatitis.

## 2023-10-02 NOTE — ED Provider Notes (Signed)
 [facility] RAL[facility] PELT    CSN: [id number] Arrival date & time: 10/02/23  0851      History   Chief Complaint Chief Complaint  Patient presents with   Abscess    HPI Christian Gardner is a 23 y.o. male.   Patient presents for evaluation of abscess present to the bilateral axilla for 10 days.  Left side worse than right.  Noticed drainage to the left side beginning 3 days ago which has resolved.  Endorses areas are firm to touch but painful making it difficult to raise the arm.  Denies fever.  Has applied warm compresses.   History reviewed. No pertinent past medical history.  There are no active problems to display for this patient.   History reviewed. No pertinent surgical history.     Home Medications    Prior to Admission medications   Medication Sig Start Date End Date Taking? Authorizing Provider  acetaminophen -codeine  (TYLENOL  #3) 300-30 MG tablet Take 1 tablet by mouth every 6 (six) hours as needed for moderate pain (pain score 4-6). 10/02/23  Yes Elda Dunkerson R, NP  doxycycline  (VIBRAMYCIN ) 100 MG capsule Take 1 capsule (100 mg total) by mouth 2 (two) times daily. 10/02/23  Yes Brewer Hitchman, Shelba SAUNDERS, NP  erythromycin  ophthalmic ointment Place a 1/2 inch ribbon of ointment into the lower eyelid 4 times daily for 7 days. 08/17/21   Hazen Darryle BRAVO, FNP  methocarbamol  (ROBAXIN ) 500 MG tablet Take 1 tablet (500 mg total) by mouth every 8 (eight) hours as needed for muscle spasms. 08/28/21   Hudnall, Ludie SAUNDERS, MD  naproxen  (NAPROSYN ) 500 MG tablet Take 1 tablet (500 mg total) by mouth 2 (two) times daily. 08/28/21   Cleatrice Ludie SAUNDERS, MD    Family History Family History  Problem Relation Age of Onset   Healthy Mother    Healthy Father     Social History Social History   Tobacco Use   Smoking status: Never   Smokeless tobacco: Never  Vaping Use   Vaping status: Never Used  Substance Use Topics   Alcohol use: Yes    Comment: occasionally   Drug use: Yes    Types:  Marijuana     Allergies   Patient has no known allergies.   Review of Systems Review of Systems   Physical Exam Triage Vital Signs ED Triage Vitals  Encounter Vitals Group     BP 10/02/23 0945 132/73     Girls Systolic BP Percentile --      Girls Diastolic BP Percentile --      Boys Systolic BP Percentile --      Boys Diastolic BP Percentile --      Pulse --      Resp 10/02/23 0945 18     Temp 10/02/23 0945 98.7 F (37.1 C)     Temp Source 10/02/23 0945 Oral     SpO2 10/02/23 0945 97 %     Weight --      Height --      Head Circumference --      Peak Flow --      Pain Score 10/02/23 0943 8     Pain Loc --      Pain Education --      Exclude from Growth Chart --    No data found.  Updated Vital Signs BP 132/73 (BP Location: Right Arm)   Temp 98.7 F (37.1 C) (Oral)   Resp 18   SpO2 97%   Visual Acuity  Right Eye Distance:   Left Eye Distance:   Bilateral Distance:    Right Eye Near:   Left Eye Near:    Bilateral Near:     Physical Exam Constitutional:      Appearance: Normal appearance.  Eyes:     Extraocular Movements: Extraocular movements intact.  Pulmonary:     Effort: Pulmonary effort is normal.  Skin:    Comments: 1 x 2 cm abscess present to the right axilla, firm and tender, nondraining  2 x 2 cm abscess present to the left axilla, firm and tender, nondraining  Neurological:     Mental Status: He is alert and oriented to person, place, and time.      UC Treatments / Results  Labs (all labs ordered are listed, but only abnormal results are displayed) Labs Reviewed - No data to display  EKG   Radiology No results found.  Procedures Procedures (including critical care time)  Medications Ordered in UC Medications - No data to display  Initial Impression / Assessment and Plan / UC Course  I have reviewed the triage vital signs and the nursing notes.  Pertinent labs & imaging results that were available during my care of the  patient were reviewed by me and considered in my medical decision making (see chart for details).  Abscess of the right axilla, abscess of left axilla  Deferring I&D due to firmness of the \\abscess  to the bilateral axilla, prescribed doxycycline  Tylenol  3, PDMP reviewed, low risk, recommended continue warm compresses and advised follow-up for nonhealing nondraining site Final Clinical Impressions(s) / UC Diagnoses   Final diagnoses:  Abscess of left axilla  Abscess of right axilla     Discharge Instructions      Due to the firmness of your abscess and we felt off on attempting to drain them and most likely will you will minimal results  Take doxycycline  twice daily for 7 days  For severe pain you may use Tylenol  3 which has codeine  every 6 hours, be mindful this can make you feel drowsy  Hold warm-hot compresses to affected area at least 4 times a day, this helps to facilitate draining, the more the better  Please return for evaluation for increased swelling, increased tenderness or pain, non healing site, non draining site, you begin to have fever or chills   We reviewed the etiology of recurrent abscesses of skin.  Skin abscesses are collections of pus within the dermis and deeper skin tissues. Skin abscesses manifest as painful, tender, fluctuant, and erythematous nodules, frequently surmounted by a pustule and surrounded by a rim of erythematous swelling.  Spontaneous drainage of purulent material may occur.  Fever can occur on occasion.    -Skin abscesses can develop in healthy individuals with no predisposing conditions other than skin or nasal carriage of Staphylococcus aureus.  Individuals in close contact with others who have active infection with skin abscesses are at increased risk which is likely to explain why twin brother has similar episodes.   In addition, any process leading to a breach in the skin barrier can also predispose to the development of a skin abscesses, such  as atopic dermatitis.      ED Prescriptions     Medication Sig Dispense Auth. Provider   doxycycline  (VIBRAMYCIN ) 100 MG capsule Take 1 capsule (100 mg total) by mouth 2 (two) times daily. 14 capsule Irini Leet R, NP   acetaminophen -codeine  (TYLENOL  #3) 300-30 MG tablet Take 1 tablet by mouth every 6 (  six) hours as needed for moderate pain (pain score 4-6). 12 tablet Katsumi Wisler R, NP      I have reviewed the PDMP during this encounter.   Teresa Shelba SAUNDERS, NP 10/02/23 1015

## 2023-10-02 NOTE — ED Triage Notes (Signed)
 Patient reports abscess under left armpit and reports he did have drainage. Patient states drainage has stop but area is hard knot. Rates pain 8/10. Patient states has been using warm compression on area. Patient also states he has small knot on right armpit area.

## 2023-10-11 DIAGNOSIS — Z419 Encounter for procedure for purposes other than remedying health state, unspecified: Secondary | ICD-10-CM | POA: Diagnosis not present
